# Patient Record
Sex: Female | Born: 1995 | Race: Black or African American | Hispanic: No | Marital: Single | Smoking: Never smoker
Health system: Southern US, Community
[De-identification: ages and names within clinical notes are randomized; demographics above are authoritative.]

## PROBLEM LIST (undated history)

## (undated) DIAGNOSIS — Z789 Other specified health status: Secondary | ICD-10-CM

## (undated) HISTORY — PX: NO PAST SURGERIES: SHX2092

## (undated) HISTORY — DX: Other specified health status: Z78.9

---
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
## 2014-07-06 ENCOUNTER — Encounter (HOSPITAL_COMMUNITY): Payer: Self-pay | Admitting: Emergency Medicine

## 2015-01-03 ENCOUNTER — Emergency Department (HOSPITAL_COMMUNITY)
Admission: EM | Admit: 2015-01-03 | Discharge: 2015-01-03 | Disposition: A | Discharge status: Home / Self Care | Payer: Self-pay | Attending: Emergency Medicine | Admitting: Emergency Medicine

## 2015-01-03 ENCOUNTER — Encounter (HOSPITAL_COMMUNITY): Payer: Self-pay | Admitting: Emergency Medicine

## 2015-01-03 DIAGNOSIS — H00013 Hordeolum externum right eye, unspecified eyelid: Secondary | ICD-10-CM

## 2015-01-03 DIAGNOSIS — H00011 Hordeolum externum right upper eyelid: Secondary | ICD-10-CM | POA: Insufficient documentation

## 2015-01-03 MED ORDER — TOBRAMYCIN 0.3 % OP SOLN
1.0000 [drp] | Freq: Once | OPHTHALMIC | Status: AC
  Administered 2015-01-03: 1 [drp] via OPHTHALMIC
  Filled 2015-01-03: qty 5

## 2015-01-03 NOTE — ED Notes (Addendum)
Patient c/o swelling to right eyelid x3-4 days. Per patient pain with blinking. Denies any blurred vision/vision changes. Patient reports using warm compress and benadryl with no relief.

## 2015-01-03 NOTE — Discharge Instructions (Signed)

## 2015-01-05 NOTE — ED Provider Notes (Signed)
CSN: [id number]     Arrival date & time 01/03/15  [id number] History   First MD Initiated Contact with Patient 01/03/15 1005     Chief Complaint  Patient presents with  . Belepharitis     (Consider location/radiation/quality/duration/timing/severity/associated sxs/prior Treatment) HPI  Julie [name] is a 19 y.o. female who presents to the Emergency Department complaining of pain, swelling and redness of the right upper eyelid.  Sx's present for 3-4 days.  Pain is worse with blinking.  She has tried benadryl and warm compresses without relief.  She denies recent illness, headaches, dizziness, or visual changes.     Past Medical History  Diagnosis Date  . Pregnant    History reviewed. No pertinent past surgical history. History reviewed. No pertinent family history. History  Substance Use Topics  . Smoking status: Never Smoker   . Smokeless tobacco: Never Used  . Alcohol Use: No   OB History    Gravida Para Term Preterm AB TAB SAB Ectopic Multiple Living   1         1     Review of Systems  Constitutional: Negative for fever, activity change and appetite change.  HENT: Negative for congestion, ear pain and trouble swallowing.   Eyes: Positive for pain and redness. Negative for discharge and visual disturbance.  Gastrointestinal: Negative for nausea, vomiting and abdominal pain.  Musculoskeletal: Negative for arthralgias.  Psychiatric/Behavioral: Negative for confusion.  All other systems reviewed and are negative.     Allergies  Bee venom  Home Medications   Prior to Admission medications   Medication Sig Start Date End Date Taking? Authorizing Provider  diphenhydrAMINE (BENADRYL) 25 MG tablet Take 50 mg by mouth daily as needed for allergies.   Yes Historical Provider, MD  etonogestrel (NEXPLANON) 68 MG IMPL implant 1 each by Subdermal route once.   Yes Historical Provider, MD   BP 125/92 mmHg  Pulse 106  Temp(Src) 98.2 F (36.8 C) (Oral)  Resp 18  Ht 5' 5.5"  (1.664 m)  Wt 133 lb 14.4 oz (60.737 kg)  BMI 21.94 kg/m2  SpO2 100%  LMP 12/18/2014 Physical Exam  Constitutional: She is oriented to person, place, and time. She appears well-developed and well-nourished. No distress.  HENT:  Head: Normocephalic and atraumatic.  Eyes: Conjunctivae and EOM are normal. Pupils are equal, round, and reactive to light. Lids are everted and swept, no foreign bodies found. Right eye exhibits hordeolum. Right conjunctiva is not injected. Right conjunctiva has no hemorrhage. Right eye exhibits normal extraocular motion. Pupils are equal.    Neck: Normal range of motion. Neck supple.  Cardiovascular: Normal rate, regular rhythm, normal heart sounds and intact distal pulses.   Pulmonary/Chest: Effort normal and breath sounds normal.  Musculoskeletal: Normal range of motion.  Lymphadenopathy:    She has no cervical adenopathy.  Neurological: She is alert and oriented to person, place, and time. She exhibits normal muscle tone. Coordination normal.  Skin: Skin is warm.  Psychiatric: She has a normal mood and affect.  Nursing note and vitals reviewed.   ED Course  Procedures (including critical care time) Labs Review Labs Reviewed - No data to display  Imaging Review No results found.   EKG Interpretation None         Visual Acuity  Right Eye Distance: 20/30 Left Eye Distance: 20/20 Bilateral Distance: 20/20  Right Eye Near:   Left Eye Near:    Bilateral Near:     MDM   Final diagnoses:  Hordeolum externum (stye), right    Pt with localized tenderness and edema of the right upper eyelid.  C/w stye.  Pt agrees to warm compresses, dispensed tobramycin eye drops and referral to Dr. Lita Mains if needed.    Pauline Aus, PA-C 01/05/15 1745  Mancel Bale, MD 01/07/15 1345

## 2015-02-02 ENCOUNTER — Emergency Department (HOSPITAL_COMMUNITY)
Admission: EM | Admit: 2015-02-02 | Discharge: 2015-02-02 | Disposition: A | Discharge status: Home / Self Care | Payer: Self-pay | Attending: Emergency Medicine | Admitting: Emergency Medicine

## 2015-02-02 ENCOUNTER — Encounter (HOSPITAL_COMMUNITY): Payer: Self-pay | Admitting: *Deleted

## 2015-02-02 DIAGNOSIS — Z3202 Encounter for pregnancy test, result negative: Secondary | ICD-10-CM | POA: Insufficient documentation

## 2015-02-02 DIAGNOSIS — R300 Dysuria: Secondary | ICD-10-CM | POA: Insufficient documentation

## 2015-02-02 LAB — URINE MICROSCOPIC-ADD ON

## 2015-02-02 LAB — URINALYSIS, ROUTINE W REFLEX MICROSCOPIC
BILIRUBIN URINE: NEGATIVE
GLUCOSE, UA: NEGATIVE mg/dL
KETONES UR: 15 mg/dL — AB
Leukocytes, UA: NEGATIVE
NITRITE: NEGATIVE
Protein, ur: NEGATIVE mg/dL
Urobilinogen, UA: 0.2 mg/dL (ref 0.0–1.0)
pH: 6 (ref 5.0–8.0)

## 2015-02-02 LAB — WET PREP, GENITAL
Trich, Wet Prep: NONE SEEN
Yeast Wet Prep HPF POC: NONE SEEN

## 2015-02-02 LAB — PREGNANCY, URINE: Preg Test, Ur: NEGATIVE

## 2015-02-02 MED ORDER — AZITHROMYCIN 250 MG PO TABS
1000.0000 mg | ORAL_TABLET | Freq: Once | ORAL | Status: AC
  Administered 2015-02-02: 1000 mg via ORAL
  Filled 2015-02-02 (×2): qty 4

## 2015-02-02 NOTE — ED Notes (Signed)
Pt c/o painful urination that has gotten progressively worse over the last few weeks

## 2015-02-02 NOTE — ED Notes (Signed)
Unable to review d/c instructions with pt due to pt leaving

## 2015-02-02 NOTE — ED Notes (Signed)
Pt had returned to room and was able to give med and review d/c instructions. Pt stated that she left to go get her phone charger in her car

## 2015-02-02 NOTE — ED Provider Notes (Signed)
CSN: [id number]     Arrival date & time 02/02/15  2003 History   First MD Initiated Contact with Patient 02/02/15 2048     Chief Complaint  Patient presents with  . Dysuria     (Consider location/radiation/quality/duration/timing/severity/associated sxs/prior Treatment) Patient is a 19 y.o. female presenting with dysuria. The history is provided by the patient.  Dysuria Pain quality:  Burning Onset quality:  Gradual Duration:  6 days Timing:  Constant Progression:  Worsening Chronicity:  New Recent urinary tract infections: no   Relieved by:  None tried Worsened by:  Nothing tried Ineffective treatments:  None tried  Julie [name] is a 19 y.o. female who presents to the ED with dysuria. LMP 5/23 and was normal. Implant for birthcontrol. Current sex partner x 2 years. Last pap smear less than 2 years ago and normal. Patient states that her boyfriend did have an STD in the past and exposed her so she had to be treated. She is not sure if it was GC or Chlamydia.    Past Medical History  Diagnosis Date  . Pregnant    History reviewed. No pertinent past surgical history. History reviewed. No pertinent family history. History  Substance Use Topics  . Smoking status: Never Smoker   . Smokeless tobacco: Never Used  . Alcohol Use: No   OB History    Gravida Para Term Preterm AB TAB SAB Ectopic Multiple Living   1         1     Review of Systems  Genitourinary: Positive for dysuria.  all other systems negative    Allergies  Bee venom  Home Medications   Prior to Admission medications   Medication Sig Start Date End Date Taking? Authorizing Provider  diphenhydrAMINE (BENADRYL) 25 MG tablet Take 50 mg by mouth daily as needed for allergies.    Historical Provider, MD  etonogestrel (NEXPLANON) 68 MG IMPL implant 1 each by Subdermal route once.    Historical Provider, MD   BP 144/80 mmHg  Pulse 71  Temp(Src) 98.7 F (37.1 C) (Oral)  Resp 20  Ht 5' 5.5" (1.664 m)   Wt 136 lb (61.689 kg)  BMI 22.28 kg/m2  SpO2 100%  LMP 01/25/2015 Physical Exam  Constitutional: She is oriented to person, place, and time. She appears well-developed and well-nourished. No distress.  HENT:  Head: Normocephalic.  Eyes: Conjunctivae and EOM are normal.  Neck: Normal range of motion. Neck supple.  Cardiovascular: Normal rate and regular rhythm.   Pulmonary/Chest: Effort normal and breath sounds normal.  Abdominal: Soft. Bowel sounds are normal. There is no tenderness.  Genitourinary:  External genitalia without lesions, white d/c vaginal vault. No CMT, no adnexal tenderness, uterus without palpable enlargement.   Musculoskeletal: Normal range of motion.  Neurological: She is alert and oriented to person, place, and time. No cranial nerve deficit.  Skin: Skin is warm and dry.  Psychiatric: She has a normal mood and affect. Her behavior is normal.  Nursing note and vitals reviewed.   ED Course  Procedures  Results for orders placed or performed during the hospital encounter of 02/02/15 (from the past 24 hour(s))  Urinalysis, Routine w reflex microscopic     Status: Abnormal   Collection Time: 02/02/15  8:51 PM  Result Value Ref Range   Color, Urine YELLOW YELLOW   APPearance CLEAR CLEAR   Specific Gravity, Urine >1.030 (H) 1.005 - 1.030   pH 6.0 5.0 - 8.0   Glucose, UA NEGATIVE  NEGATIVE mg/dL   Hgb urine dipstick TRACE (A) NEGATIVE   Bilirubin Urine NEGATIVE NEGATIVE   Ketones, ur 15 (A) NEGATIVE mg/dL   Protein, ur NEGATIVE NEGATIVE mg/dL   Urobilinogen, UA 0.2 0.0 - 1.0 mg/dL   Nitrite NEGATIVE NEGATIVE   Leukocytes, UA NEGATIVE NEGATIVE  Pregnancy, urine     Status: None   Collection Time: 02/02/15  8:51 PM  Result Value Ref Range   Preg Test, Ur NEGATIVE NEGATIVE  Urine microscopic-add on     Status: Abnormal   Collection Time: 02/02/15  8:51 PM  Result Value Ref Range   Squamous Epithelial / LPF FEW (A) RARE   WBC, UA 0-2 <3 WBC/hpf   RBC / HPF 0-2  <3 RBC/hpf   Bacteria, UA FEW (A) RARE   Urine-Other MUCOUS PRESENT   Wet prep, genital     Status: Abnormal   Collection Time: 02/02/15 10:36 PM  Result Value Ref Range   Yeast Wet Prep HPF POC NONE SEEN NONE SEEN   Trich, Wet Prep NONE SEEN NONE SEEN   Clue Cells Wet Prep HPF POC FEW (A) NONE SEEN   WBC, Wet Prep HPF POC FEW (A) NONE SEEN     MDM  19 y.o. female with dysuria and no other symptoms. Culture for Urine, GC and Chlamydia pending. Will treat with Zithromax while cultures pending. Patient will drink cranberry juice and take OTC AZO. She will return for worsening symptoms. She understands that we will only call for positive cultures. Stable for d/c without signs of pyelo or PID.    Final diagnoses:  Dysuria       Janne NapoleonHope M Neese, NP 02/02/15 [id number]  Vanetta MuldersScott Zackowski, MD 02/04/15 [phone]

## 2015-02-02 NOTE — Discharge Instructions (Signed)
We have sent the urine for culture and will only call if you need additional treatment.

## 2015-02-02 NOTE — ED Notes (Signed)
Went in pt's room to give zithromax and pt no longer in room, attempted to call number provided on snapshot and unable to have call go through, [facility] NP made aware that pt was no longer in room

## 2015-02-04 LAB — URINE CULTURE

## 2015-02-04 LAB — GC/CHLAMYDIA PROBE AMP (~~LOC~~) NOT AT ARMC
Chlamydia: NEGATIVE
NEISSERIA GONORRHEA: NEGATIVE

## 2015-08-24 ENCOUNTER — Encounter (HOSPITAL_COMMUNITY): Payer: Self-pay | Admitting: *Deleted

## 2015-08-24 ENCOUNTER — Emergency Department (HOSPITAL_COMMUNITY)
Admission: EM | Admit: 2015-08-24 | Discharge: 2015-08-24 | Disposition: A | Discharge status: Home / Self Care | Payer: Self-pay | Attending: Emergency Medicine | Admitting: Emergency Medicine

## 2015-08-24 DIAGNOSIS — M791 Myalgia: Secondary | ICD-10-CM | POA: Insufficient documentation

## 2015-08-24 DIAGNOSIS — J039 Acute tonsillitis, unspecified: Secondary | ICD-10-CM | POA: Insufficient documentation

## 2015-08-24 LAB — RAPID STREP SCREEN (MED CTR MEBANE ONLY): Streptococcus, Group A Screen (Direct): NEGATIVE

## 2015-08-24 MED ORDER — MAGIC MOUTHWASH W/LIDOCAINE: 5.0000 mL | Freq: Three times a day (TID) | ORAL | Status: DC | PRN

## 2015-08-24 MED ORDER — PREDNISONE 20 MG PO TABS: 40.0000 mg | ORAL_TABLET | Freq: Every day | ORAL | Status: DC

## 2015-08-24 NOTE — ED Notes (Signed)
Pt reporting sore throat since yesterday.  States that she looked at her tonsils and they are swollen and have white patches.

## 2015-08-24 NOTE — Discharge Instructions (Signed)
Tonsillitis °Tonsillitis is an infection of the throat. This infection causes the tonsils to become red, tender, and puffy (swollen). Tonsils are groups of tissue at the back of your throat. If bacteria caused your infection, antibiotic medicine will be given to you. Sometimes symptoms of tonsillitis can be relieved with the use of steroid medicine. If your tonsillitis is severe and happens often, you may need to get your tonsils removed (tonsillectomy). °HOME CARE  °· Rest and sleep often. °· Drink enough fluids to keep your pee (urine) clear or pale yellow. °· While your throat is sore, eat soft or liquid foods like: °¨ Soup. °¨ Ice cream. °¨ Instant breakfast drinks. °· Eat frozen ice pops. °· Gargle with a warm or cold liquid to help soothe the throat. Gargle with a water and salt mix. Mix 1/4 teaspoon of salt and 1/4 teaspoon of baking soda in 1 cup of water. °· Only take medicines as told by your doctor. °· If you are given medicines (antibiotics), take them as told. Finish them even if you start to feel better. °GET HELP IF: °· You have large, tender lumps in your neck. °· You have a rash. °· You cough up green, yellow-brown, or bloody fluid. °· You cannot swallow liquids or food for 24 hours. °· You notice that only one of your tonsils is swollen. °GET HELP RIGHT AWAY IF:  °· You throw up (vomit). °· You have a very bad headache. °· You have a stiff neck. °· You have chest pain. °· You have trouble breathing or swallowing. °· You have bad throat pain, drooling, or your voice changes. °· You have bad pain not helped by medicine. °· You cannot fully open your mouth. °· You have redness, puffiness, or bad pain in the neck. °· You have a fever. °MAKE SURE YOU:  °· Understand these instructions. °· Will watch your condition. °· Will get help right away if you are not doing well or get worse. °  °This information is not intended to replace advice given to you by your health care provider. Make sure you discuss any  questions you have with your health care provider. °  °Document Released: 02/07/2008 Document Revised: 08/26/2013 Document Reviewed: 02/07/2013 °[employer]vier Interactive Patient Education ©2016 [employer] ° °

## 2015-08-24 NOTE — ED Provider Notes (Signed)
CSN: [id number]     Arrival date & time 08/24/15  1901 History   First MD Initiated Contact with Patient 08/24/15 1909     Chief Complaint  Patient presents with  . Sore Throat     (Consider location/radiation/quality/duration/timing/severity/associated sxs/prior Treatment) HPI  Julie [name] is a 19 y.o. female who presents to the Emergency Department complaining of sore throat for one day.  She states that she noticed a mild pain with swallowing yesterday, but woke up with increased pain to her throat, swelling and "white patches" on her tonsils.  Pain is worse with swallowing.  She also c/o generalized body aches.  She denies congestion, cough, ear pain, and fever.  She has tried OTC analgesics w/o relief.     Past Medical History  Diagnosis Date  . Pregnant    History reviewed. No pertinent past surgical history. History reviewed. No pertinent family history. Social History  Substance Use Topics  . Smoking status: Never Smoker   . Smokeless tobacco: Never Used  . Alcohol Use: No   OB History    Gravida Para Term Preterm AB TAB SAB Ectopic Multiple Living   1         1     Review of Systems  Constitutional: Negative for fever, chills, activity change and appetite change.  HENT: Positive for congestion and sore throat. Negative for ear pain, facial swelling, trouble swallowing and voice change.   Eyes: Negative for pain and visual disturbance.  Respiratory: Negative for cough and shortness of breath.   Gastrointestinal: Negative for nausea, vomiting and abdominal pain.  Genitourinary: Negative for dysuria.  Musculoskeletal: Positive for myalgias. Negative for arthralgias, neck pain and neck stiffness.  Skin: Negative for color change and rash.  Neurological: Negative for dizziness, facial asymmetry, speech difficulty, numbness and headaches.  Hematological: Negative for adenopathy.  All other systems reviewed and are negative.     Allergies  Bee venom  Home  Medications   Prior to Admission medications   Medication Sig Start Date End Date Taking? Authorizing Provider  diphenhydrAMINE (BENADRYL) 25 MG tablet Take 50 mg by mouth daily as needed for allergies.    Historical Provider, MD  etonogestrel (NEXPLANON) 68 MG IMPL implant 1 each by Subdermal route once.    Historical Provider, MD   BP 140/75 mmHg  Pulse 92  Temp(Src) 98.8 F (37.1 C) (Oral)  Resp 15  Ht  (1.651 m)  Wt 58.968 kg  BMI 21.63 kg/m2  SpO2 100%  LMP 08/17/2015 Physical Exam  Constitutional: She is oriented to person, place, and time. She appears well-developed and well-nourished. No distress.  HENT:  Head: Normocephalic and atraumatic.  Right Ear: Tympanic membrane and ear canal normal.  Left Ear: Tympanic membrane and ear canal normal.  Mouth/Throat: Uvula is midline and mucous membranes are normal. No trismus in the jaw. No uvula swelling. Oropharyngeal exudate, posterior oropharyngeal edema and posterior oropharyngeal erythema present. No tonsillar abscesses.  Edema, erythema and exudates present bilateral tonsils.  Uvula is non-edematous and midline.  No PTA  Neck: Normal range of motion. Neck supple.  Cardiovascular: Normal rate, regular rhythm and normal heart sounds.   Pulmonary/Chest: Effort normal and breath sounds normal. No respiratory distress.  Abdominal: There is no splenomegaly. There is no tenderness.  Musculoskeletal: Normal range of motion.  Lymphadenopathy:    She has no cervical adenopathy.  Neurological: She is alert and oriented to person, place, and time. She exhibits normal muscle tone. Coordination normal.  Skin: Skin is warm and dry.  Nursing note and vitals reviewed.   ED Course  Procedures (including critical care time) Labs Review Labs Reviewed  RAPID STREP SCREEN (NOT [facility])  CULTURE, GROUP A STREP    Imaging Review No results found. I have personally reviewed and evaluated these images and lab results as part of my  medical decision-making.   EKG Interpretation None      MDM   Final diagnoses:  Tonsillitis    Pt is well appearing, non-toxic.  Airway patent.  No peritonsillar abscess.  Likely tonsillitis.  Strep screen neg although questionable false positive.  Pt agrees to increased fluids, prednisone and magic mouthwash.      Rosey Bathammy Hollis Oh, PA-C 08/25/15 2206  Azalia BilisKevin Campos, MD 08/25/15 2245

## 2015-08-26 LAB — CULTURE, GROUP A STREP

## 2015-11-19 ENCOUNTER — Emergency Department (HOSPITAL_COMMUNITY): Payer: Self-pay

## 2015-11-19 ENCOUNTER — Encounter (HOSPITAL_COMMUNITY): Payer: Self-pay | Admitting: *Deleted

## 2015-11-19 ENCOUNTER — Emergency Department (HOSPITAL_COMMUNITY)
Admission: EM | Admit: 2015-11-19 | Discharge: 2015-11-19 | Disposition: A | Discharge status: Home / Self Care | Payer: Self-pay | Attending: Emergency Medicine | Admitting: Emergency Medicine

## 2015-11-19 DIAGNOSIS — R059 Cough, unspecified: Secondary | ICD-10-CM

## 2015-11-19 DIAGNOSIS — R05 Cough: Secondary | ICD-10-CM

## 2015-11-19 DIAGNOSIS — Z79899 Other long term (current) drug therapy: Secondary | ICD-10-CM | POA: Insufficient documentation

## 2015-11-19 DIAGNOSIS — J069 Acute upper respiratory infection, unspecified: Secondary | ICD-10-CM | POA: Insufficient documentation

## 2015-11-19 MED ORDER — GUAIFENESIN-CODEINE 100-10 MG/5ML PO SYRP: 10.0000 mL | ORAL_SOLUTION | Freq: Three times a day (TID) | ORAL | Status: DC | PRN

## 2015-11-19 MED ORDER — AZITHROMYCIN 250 MG PO TABS: 250.0000 mg | ORAL_TABLET | Freq: Every day | ORAL | Status: DC

## 2015-11-19 NOTE — ED Notes (Signed)
Pt reports cough that started yesterday, productive at times, green in color. Hoarse today. Denies pain or fevers.

## 2015-11-19 NOTE — Discharge Instructions (Signed)

## 2015-11-20 NOTE — ED Provider Notes (Signed)
CSN: [id number]     Arrival date & time 11/19/15  0946 History   First MD Initiated Contact with Patient 11/19/15 1015     Chief Complaint  Patient presents with  . Cough     (Consider location/radiation/quality/duration/timing/severity/associated sxs/prior Treatment) HPI  RIO TABER is a 20 y.o. female who presents to the Emergency Department complaining of cough for one day.  She states the cough is productive at times and associated with a loss of her voice.  She reports harsh coughing.  She denies fever, shortness of breath, chest pain, abd pain, sore throat.  She has not taken any medications prior to ED arrival.     Past Medical History  Diagnosis Date  . Pregnant    History reviewed. No pertinent past surgical history. History reviewed. No pertinent family history. Social History  Substance Use Topics  . Smoking status: Never Smoker   . Smokeless tobacco: Never Used  . Alcohol Use: No   OB History    Gravida Para Term Preterm AB TAB SAB Ectopic Multiple Living   1         1     Review of Systems  Constitutional: Negative for fever, chills and appetite change.  HENT: Positive for congestion and voice change. Negative for sore throat and trouble swallowing.   Respiratory: Positive for cough and chest tightness. Negative for shortness of breath and wheezing.   Cardiovascular: Negative for chest pain.  Gastrointestinal: Negative for nausea, vomiting and abdominal pain.  Genitourinary: Negative for dysuria.  Musculoskeletal: Negative for arthralgias.  Skin: Negative for rash.  Neurological: Negative for dizziness, weakness and numbness.  Hematological: Negative for adenopathy.  All other systems reviewed and are negative.     Allergies  Bee venom  Home Medications   Prior to Admission medications   Medication Sig Start Date End Date Taking? Authorizing Provider  etonogestrel (NEXPLANON) 68 MG IMPL implant 1 each by Subdermal route once.   Yes Historical  Provider, MD  Nutritional Supplements (COLD AND FLU PO) Take 1 tablet by mouth daily as needed (cold).   Yes Historical Provider, MD  azithromycin (ZITHROMAX) 250 MG tablet Take 1 tablet (250 mg total) by mouth daily. Take first 2 tablets together, then 1 every day until finished. 11/19/15   Marciano Mundt, PA-C  guaiFENesin-codeine (ROBITUSSIN AC) 100-10 MG/5ML syrup Take 10 mLs by mouth 3 (three) times daily as needed. 11/19/15   Francile Woolford, PA-C  magic mouthwash w/lidocaine SOLN Take 5 mLs by mouth 3 (three) times daily as needed for mouth pain. Swish and spit, do not swallow Patient not taking: Reported on 11/19/2015 08/24/15   Ahron Hulbert, PA-C  predniSONE (DELTASONE) 20 MG tablet Take 2 tablets (40 mg total) by mouth daily. For 5 days Patient not taking: Reported on 11/19/2015 08/24/15   Cadon Raczka, PA-C   BP 130/81 mmHg  Pulse 91  Temp(Src) 98.8 F (37.1 C) (Temporal)  Resp 16  Ht 5' 5.5" (1.664 m)  Wt 58.968 kg  BMI 21.30 kg/m2  SpO2 100%  LMP 11/15/2015 Physical Exam  Constitutional: She is oriented to person, place, and time. She appears well-developed and well-nourished. No distress.  HENT:  Head: Normocephalic and atraumatic.  Right Ear: Tympanic membrane and ear canal normal.  Left Ear: Tympanic membrane and ear canal normal.  Mouth/Throat: Uvula is midline, oropharynx is clear and moist and mucous membranes are normal. No oropharyngeal exudate.  Eyes: EOM are normal. Pupils are equal, round, and reactive to light.  Neck: Normal range of motion, full passive range of motion without pain and phonation normal. Neck supple.  Cardiovascular: Normal rate, regular rhythm and intact distal pulses.   No murmur heard. Pulmonary/Chest: Effort normal. No stridor. No respiratory distress. She has no wheezes. She has no rales. She exhibits no tenderness.  Coarse lungs sounds bilaterally.  No rales or wheezing.    Musculoskeletal: She exhibits no edema.  Lymphadenopathy:     She has no cervical adenopathy.  Neurological: She is alert and oriented to person, place, and time. She exhibits normal muscle tone. Coordination normal.  Skin: Skin is warm and dry.  Nursing note and vitals reviewed.   ED Course  Procedures (including critical care time) Labs Review Labs Reviewed - No data to display  Imaging Review Dg Chest 2 View  11/19/2015  CLINICAL DATA:  Cough for 1 day EXAM: CHEST  2 VIEW COMPARISON:  03/12/2004 FINDINGS: Normal heart size. Tenting of the left hemidiaphragm is stable. Lungs are otherwise clear. No pneumothorax. No pleural effusion. IMPRESSION: No active cardiopulmonary disease. Electronically Signed   By: Jolaine ClickArthur  Hoss M.D.   On: 11/19/2015 10:51   I have personally reviewed and evaluated these images and lab results as part of my medical decision-making.   EKG Interpretation None      MDM   Final diagnoses:  URI (upper respiratory infection)  Cough    Pt is not pregnant, has nexplanon implant.    Well appearing, non-toxic. Vitals stable.  No respiratory distress.  CXR for PNA.  Pt stable for d/c and tx plan discussed and appears stable for d/c    Pauline Ausammy Claritza July, PA-C 11/20/15 2326  Samuel JesterKathleen McManus, DO 11/24/15 1743

## 2016-01-17 ENCOUNTER — Encounter (HOSPITAL_COMMUNITY): Payer: Self-pay

## 2016-01-17 ENCOUNTER — Emergency Department (HOSPITAL_COMMUNITY)
Admission: EM | Admit: 2016-01-17 | Discharge: 2016-01-17 | Disposition: A | Discharge status: Home / Self Care | Payer: Self-pay | Attending: Emergency Medicine | Admitting: Emergency Medicine

## 2016-01-17 DIAGNOSIS — Z79899 Other long term (current) drug therapy: Secondary | ICD-10-CM | POA: Insufficient documentation

## 2016-01-17 DIAGNOSIS — B349 Viral infection, unspecified: Secondary | ICD-10-CM | POA: Insufficient documentation

## 2016-01-17 DIAGNOSIS — R109 Unspecified abdominal pain: Secondary | ICD-10-CM | POA: Insufficient documentation

## 2016-01-17 MED ORDER — PROMETHAZINE-DM 6.25-15 MG/5ML PO SYRP: 10.0000 mL | ORAL_SOLUTION | Freq: Four times a day (QID) | ORAL | Status: DC | PRN

## 2016-01-17 MED ORDER — LORATADINE-PSEUDOEPHEDRINE ER 5-120 MG PO TB12: 1.0000 | ORAL_TABLET | Freq: Two times a day (BID) | ORAL | Status: DC

## 2016-01-17 NOTE — ED Provider Notes (Signed)
CSN: [id number]     Arrival date & time 01/17/16  1134 History  By signing my name below, I, Placido SouLogan Joldersma, attest that this documentation has been prepared under the direction and in the presence of Ivery QualeHobson Shirle Provencal, PA-C. Electronically Signed: Placido SouLogan Joldersma, ED Scribe. 01/17/2016. 1:35 PM.   Chief Complaint  Patient presents with  . Nasal Congestion   The history is provided by the patient. No language interpreter was used.    HPI Comments: Julie [name] is a 20 y.o. female who is otherwise healthy presents to the Emergency Department complaining of mild nasal congestion onset 2 days ago and worsened beginning last night. She reports associated fatigue, abd pain and vomiting which she describes as yellow with 3x including traces of blood. She denies any known sick contacts. Pt denies fevers, chills, urinary frequency or dysuria.   No past medical history on file. History reviewed. No pertinent past surgical history. No family history on file. Social History  Substance Use Topics  . Smoking status: Never Smoker   . Smokeless tobacco: Never Used  . Alcohol Use: No   OB History    Gravida Para Term Preterm AB TAB SAB Ectopic Multiple Living   1         1     Review of Systems  Constitutional: Positive for fatigue. Negative for fever and chills.  HENT: Positive for congestion and rhinorrhea.   Gastrointestinal: Positive for nausea, vomiting and abdominal pain.  Genitourinary: Negative for dysuria and frequency.  All other systems reviewed and are negative.   Allergies  Bee venom  Home Medications   Prior to Admission medications   Medication Sig Start Date End Date Taking? Authorizing Provider  azithromycin (ZITHROMAX) 250 MG tablet Take 1 tablet (250 mg total) by mouth daily. Take first 2 tablets together, then 1 every day until finished. 11/19/15   Tammy Triplett, PA-C  etonogestrel (NEXPLANON) 68 MG IMPL implant 1 each by Subdermal route once.    Historical Provider, MD   guaiFENesin-codeine (ROBITUSSIN AC) 100-10 MG/5ML syrup Take 10 mLs by mouth 3 (three) times daily as needed. 11/19/15   Tammy Triplett, PA-C  magic mouthwash w/lidocaine SOLN Take 5 mLs by mouth 3 (three) times daily as needed for mouth pain. Swish and spit, do not swallow Patient not taking: Reported on 11/19/2015 08/24/15   Tammy Triplett, PA-C  Nutritional Supplements (COLD AND FLU PO) Take 1 tablet by mouth daily as needed (cold).    Historical Provider, MD  predniSONE (DELTASONE) 20 MG tablet Take 2 tablets (40 mg total) by mouth daily. For 5 days Patient not taking: Reported on 11/19/2015 08/24/15   Tammy Triplett, PA-C   BP 142/76 mmHg  Pulse 101  Temp(Src) 98.5 F (36.9 C) (Oral)  Resp 18  Ht 5' 5.5" (1.664 m)  Wt 144 lb 8 oz (65.545 kg)  BMI 23.67 kg/m2  SpO2 98%  LMP 12/16/2015    Physical Exam  Constitutional: She is oriented to person, place, and time. She appears well-developed and well-nourished.  HENT:  Head: Normocephalic and atraumatic.  Nose: Rhinorrhea present.  Mouth/Throat: Uvula swelling present.  Mild swelling of the uvula. Craters noted of the tonsils. No abscess visualized. Nasal congestion is present.   Eyes: EOM are normal.  Neck: Normal range of motion.  Cardiovascular: Normal rate.   Pulses:      Dorsalis pedis pulses are 2+ on the right side, and 2+ on the left side.  Pulmonary/Chest: Effort normal and breath sounds  normal. No respiratory distress. She has no wheezes. She has no rales. She exhibits no tenderness.  Symmetrical rise and fall of the chest.   Abdominal: Soft. Bowel sounds are normal. There is tenderness.  Diffuse soreness of the abd.   Musculoskeletal: Normal range of motion.  Lymphadenopathy:    She has no cervical adenopathy.  Neurological: She is alert and oriented to person, place, and time.  Skin: Skin is warm and dry. No rash noted.  Psychiatric: She has a normal mood and affect.  Nursing note and vitals reviewed.   ED  Course  Procedures  DIAGNOSTIC STUDIES: Oxygen Saturation is 98% on RA, normal by my interpretation.    COORDINATION OF CARE: 1:30 PM Discussed next steps with pt including Claritin D, Promethazine DM, tylenol and a recommendation for increased fluids. Return precautions noted. She verbalized understanding and is agreeable with the plan.   Labs Review Labs Reviewed - No data to display  Imaging Review No results found.   EKG Interpretation None      MDM  The examination favors a viral illness. The vital signs have been reviewed. A mask is been provided for the patient. Patient is treated with Claritin-D and Promethazine DM. The patient is to follow the primary physician if not improving, or return to the emergency room.    Final diagnoses:  Viral illness    *I have reviewed nursing notes, vital signs, and all appropriate lab and imaging results for this patient.**  **I personally performed the services described in this documentation, which was scribed in my presence. The recorded information has been reviewed and is accurate.   Ivery Quale, PA-C 01/17/16 2133  Geoffery Lyons, MD 01/18/16 [phone]

## 2016-01-17 NOTE — ED Notes (Signed)
Pt with productive cough of thick yellow phlegm for 2 days, denies fever, body aches and intermittent chills and feeling hot. Denies taking anything for it

## 2016-01-17 NOTE — ED Notes (Addendum)
Pt reports that she woke up with nasal congestion and cough. Coughing which is making her vomit. Feels weak and has generalized body aches

## 2016-01-17 NOTE — Discharge Instructions (Signed)
Wash hands frequently. Increase fluids. Use medications as suggested. Use tylenol every 4 hours or ibuprofen every 6 hours for fever or aching. Viral Infections A viral infection can be caused by different types of viruses.Most viral infections are not serious and resolve on their own. However, some infections may cause severe symptoms and may lead to further complications. SYMPTOMS Viruses can frequently cause:  Minor sore throat.  Aches and pains.  Headaches.  Runny nose.  Different types of rashes.  Watery eyes.  Tiredness.  Cough.  Loss of appetite.  Gastrointestinal infections, resulting in nausea, vomiting, and diarrhea. These symptoms do not respond to antibiotics because the infection is not caused by bacteria. However, you might catch a bacterial infection following the viral infection. This is sometimes called a "superinfection." Symptoms of such a bacterial infection may include:  Worsening sore throat with pus and difficulty swallowing.  Swollen neck glands.  Chills and a high or persistent fever.  Severe headache.  Tenderness over the sinuses.  Persistent overall ill feeling (malaise), muscle aches, and tiredness (fatigue).  Persistent cough.  Yellow, green, or brown mucus production with coughing. HOME CARE INSTRUCTIONS   Only take over-the-counter or prescription medicines for pain, discomfort, diarrhea, or fever as directed by your caregiver.  Drink enough water and fluids to keep your urine clear or pale yellow. Sports drinks can provide valuable electrolytes, sugars, and hydration.  Get plenty of rest and maintain proper nutrition. Soups and broths with crackers or rice are fine. SEEK IMMEDIATE MEDICAL CARE IF:   You have severe headaches, shortness of breath, chest pain, neck pain, or an unusual rash.  You have uncontrolled vomiting, diarrhea, or you are unable to keep down fluids.  You or your child has an oral temperature above 102 F  (38.9 C), not controlled by medicine.  Your baby is older than 3 months with a rectal temperature of 102 F (38.9 C) or higher.  Your baby is 23 months old or younger with a rectal temperature of 100.4 F (38 C) or higher. MAKE SURE YOU:   Understand these instructions.  Will watch your condition.  Will get help right away if you are not doing well or get worse.   This information is not intended to replace advice given to you by your health care provider. Make sure you discuss any questions you have with your health care provider.   Document Released: 05/31/2005 Document Revised: 11/13/2011 Document Reviewed: 01/27/2015 [employer] Interactive Patient Education [employer]2016 [employer]

## 2016-05-19 ENCOUNTER — Encounter (HOSPITAL_COMMUNITY): Payer: Self-pay | Admitting: *Deleted

## 2016-05-19 ENCOUNTER — Emergency Department (HOSPITAL_COMMUNITY)
Admission: EM | Admit: 2016-05-19 | Discharge: 2016-05-19 | Disposition: A | Discharge status: Home / Self Care | Payer: Self-pay | Attending: Emergency Medicine | Admitting: Emergency Medicine

## 2016-05-19 ENCOUNTER — Emergency Department (HOSPITAL_COMMUNITY): Payer: Self-pay

## 2016-05-19 DIAGNOSIS — J069 Acute upper respiratory infection, unspecified: Secondary | ICD-10-CM | POA: Insufficient documentation

## 2016-05-19 DIAGNOSIS — Z79899 Other long term (current) drug therapy: Secondary | ICD-10-CM | POA: Insufficient documentation

## 2016-05-19 DIAGNOSIS — R112 Nausea with vomiting, unspecified: Secondary | ICD-10-CM | POA: Insufficient documentation

## 2016-05-19 DIAGNOSIS — R197 Diarrhea, unspecified: Secondary | ICD-10-CM | POA: Insufficient documentation

## 2016-05-19 LAB — COMPREHENSIVE METABOLIC PANEL
ALBUMIN: 4.2 g/dL (ref 3.5–5.0)
ALT: 26 U/L (ref 14–54)
ANION GAP: 12 (ref 5–15)
AST: 20 U/L (ref 15–41)
Alkaline Phosphatase: 66 U/L (ref 38–126)
BUN: 6 mg/dL (ref 6–20)
CHLORIDE: 99 mmol/L — AB (ref 101–111)
CO2: 25 mmol/L (ref 22–32)
Calcium: 9.9 mg/dL (ref 8.9–10.3)
Creatinine, Ser: 0.74 mg/dL (ref 0.44–1.00)
GFR calc non Af Amer: >60 mL/min (ref >60)
GLUCOSE: 83 mg/dL (ref 65–99)
POTASSIUM: 4 mmol/L (ref 3.5–5.1)
Sodium: 136 mmol/L (ref 135–145)
Total Bilirubin: 0.5 mg/dL (ref 0.3–1.2)
Total Protein: 8.5 g/dL — ABNORMAL HIGH (ref 6.5–8.1)

## 2016-05-19 LAB — URINALYSIS, ROUTINE W REFLEX MICROSCOPIC
Bilirubin Urine: NEGATIVE
Glucose, UA: NEGATIVE mg/dL
Ketones, ur: >80 mg/dL — AB
Leukocytes, UA: NEGATIVE
NITRITE: NEGATIVE
Protein, ur: NEGATIVE mg/dL
SPECIFIC GRAVITY, URINE: 1.01 (ref 1.005–1.030)
pH: 6.5 (ref 5.0–8.0)

## 2016-05-19 LAB — LIPASE, BLOOD: LIPASE: 14 U/L (ref 11–51)

## 2016-05-19 LAB — URINE MICROSCOPIC-ADD ON: WBC UA: NONE SEEN WBC/hpf (ref 0–5)

## 2016-05-19 LAB — CBC
HEMATOCRIT: 39.7 % (ref 36.0–46.0)
HEMOGLOBIN: 13.6 g/dL (ref 12.0–15.0)
MCH: 30.2 pg (ref 26.0–34.0)
MCHC: 34.3 g/dL (ref 30.0–36.0)
MCV: 88.2 fL (ref 78.0–100.0)
Platelets: 286 10*3/uL (ref 150–400)
RBC: 4.5 MIL/uL (ref 3.87–5.11)
RDW: 13 % (ref 11.5–15.5)
WBC: 9.8 10*3/uL (ref 4.0–10.5)

## 2016-05-19 LAB — PREGNANCY, URINE: PREG TEST UR: NEGATIVE

## 2016-05-19 MED ORDER — GI COCKTAIL ~~LOC~~
30.0000 mL | Freq: Once | ORAL | Status: AC
  Administered 2016-05-19: 30 mL via ORAL
  Filled 2016-05-19: qty 30

## 2016-05-19 MED ORDER — ONDANSETRON HCL 4 MG PO TABS: 4.0000 mg | ORAL_TABLET | Freq: Three times a day (TID) | ORAL | 0 refills | Status: DC | PRN

## 2016-05-19 MED ORDER — ONDANSETRON 8 MG PO TBDP
8.0000 mg | ORAL_TABLET | Freq: Once | ORAL | Status: AC
  Administered 2016-05-19: 8 mg via ORAL
  Filled 2016-05-19: qty 1

## 2016-05-19 NOTE — ED Provider Notes (Signed)
[facility] DEPT Provider Note   CSN: [id number] Arrival date & time: 05/19/16  4098     History   Chief Complaint Chief Complaint  Patient presents with  . Abdominal Pain  . Cough    HPI Julie [name] is a 21 y.o. female.  HPI  Pt was seen at 1140. Per pt, c/o gradual onset and persistence of multiple intermittent episodes of N/V/D that began 3 days ago.   Describes the stools as "watery."  Has been associated with upper abd "pain," sinus congestion, runny/stuffy nose and cough. Denies CP/SOB, no back pain, no fevers, no black or blood in stools or emesis.    History reviewed. No pertinent past medical history.  There are no active problems to display for this patient.   History reviewed. No pertinent surgical history.  OB History    Gravida Para Term Preterm AB Living   1         1   SAB TAB Ectopic Multiple Live Births                   Home Medications    Prior to Admission medications   Medication Sig Start Date End Date Taking? Authorizing Provider  Nutritional Supplements (COLD AND FLU PO) Take 2 capsules by mouth every 4 (four) hours as needed (cold).   Yes Historical Provider, MD  etonogestrel (NEXPLANON) 68 MG IMPL implant 1 each by Subdermal route once.    Historical Provider, MD    Family History No family history on file.  Social History Social History  Substance Use Topics  . Smoking status: Never Smoker  . Smokeless tobacco: Never Used  . Alcohol use No     Allergies   Bee venom   Review of Systems Review of Systems ROS: Statement: All systems negative except as marked or noted in the HPI; Constitutional: Negative for fever and chills. ; ; Eyes: Negative for eye pain, redness and discharge. ; ; ENMT: Negative for ear pain, hoarseness, sore throat. +nasal congestion, sinus pressure and rhinorrhea. ; ; Cardiovascular: Negative for chest pain, palpitations, diaphoresis, dyspnea and peripheral edema. ; ; Respiratory: +cough. Negative  for wheezing and stridor. ; ; Gastrointestinal: +N/V/D, abd pain. Negative for blood in stool, hematemesis, jaundice and rectal bleeding. . ; ; Genitourinary: Negative for dysuria, flank pain and hematuria. ; ; Musculoskeletal: Negative for back pain and neck pain. Negative for swelling and trauma.; ; Skin: Negative for pruritus, rash, abrasions, blisters, bruising and skin lesion.; ; Neuro: Negative for headache, lightheadedness and neck stiffness. Negative for weakness, altered level of consciousness, altered mental status, extremity weakness, paresthesias, involuntary movement, seizure and syncope.       Physical Exam Updated Vital Signs BP 120/73 (BP Location: Left Arm)   Pulse 88   Temp 98.2 F (36.8 C) (Oral)   Resp 14   Ht 5\' 5"  (1.651 m)   Wt 139 lb (63 kg)   LMP 04/23/2016 Comment: neg preg test  SpO2 99%   BMI 23.13 kg/m   Physical Exam 1145: Physical examination:  Nursing notes reviewed; Vital signs and O2 SAT reviewed;  Constitutional: Well developed, Well nourished, Well hydrated, In no acute distress; Head:  Normocephalic, atraumatic; Eyes: EOMI, PERRL, No scleral icterus; ENMT: TM's clear bilat. +edemetous nasal turbinates bilat with clear rhinorrhea. Mouth and pharynx without lesions. No tonsillar exudates. No intra-oral edema. No submandibular or sublingual edema. No hoarse voice, no drooling, no stridor. No pain with manipulation of larynx. No trismus.  Mouth and pharynx normal, Mucous membranes moist; Neck: Supple, Full range of motion, No lymphadenopathy; Cardiovascular: Regular rate and rhythm, No murmur, rub, or gallop; Respiratory: Breath sounds clear & equal bilaterally, No rales, rhonchi, wheezes.  Speaking full sentences with ease, Normal respiratory effort/excursion; Chest: Nontender, Movement normal; Abdomen: Soft, +mid mid-epigastric tenderness to palp. No rebound or guarding. Nondistended, Normal bowel sounds; Genitourinary: No CVA tenderness; Extremities: Pulses  normal, No tenderness, No edema, No calf edema or asymmetry.; Neuro: AA&Ox3, Major CN grossly intact.  Speech clear. No gross focal motor or sensory deficits in extremities. Climbs on and off stretcher easily by herself. Gait steady.; Skin: Color normal, Warm, Dry.   ED Treatments / Results  Labs (all labs ordered are listed, but only abnormal results are displayed)   EKG  EKG Interpretation None       Radiology   Procedures Procedures (including critical care time)  Medications Ordered in ED Medications  ondansetron (ZOFRAN-ODT) disintegrating tablet 8 mg (8 mg Oral Given 05/19/16 1212)  gi cocktail (Maalox,Lidocaine,Donnatal) (30 mLs Oral Given 05/19/16 1212)     Initial Impression / Assessment and Plan / ED Course  I have reviewed the triage vital signs and the nursing notes.  Pertinent labs & imaging results that were available during my care of the patient were reviewed by me and considered in my medical decision making (see chart for details).  MDM Reviewed: previous chart, nursing note and vitals Reviewed previous: labs Interpretation: labs and x-ray    Results for orders placed or performed during the hospital encounter of 05/19/16  Lipase, blood  Result Value Ref Range   Lipase 14 11 - 51 U/L  Comprehensive metabolic panel  Result Value Ref Range   Sodium 136 135 - 145 mmol/L   Potassium 4.0 3.5 - 5.1 mmol/L   Chloride 99 (L) 101 - 111 mmol/L   CO2 25 22 - 32 mmol/L   Glucose, Bld 83 65 - 99 mg/dL   BUN 6 6 - 20 mg/dL   Creatinine, Ser 8.290.74 0.44 - 1.00 mg/dL   Calcium 9.9 8.9 - 56.210.3 mg/dL   Total Protein 8.5 (H) 6.5 - 8.1 g/dL   Albumin 4.2 3.5 - 5.0 g/dL   AST 20 15 - 41 U/L   ALT 26 14 - 54 U/L   Alkaline Phosphatase 66 38 - 126 U/L   Total Bilirubin 0.5 0.3 - 1.2 mg/dL   GFR calc non Af Amer >60 >60 mL/min   GFR calc Af Amer >60 >60 mL/min   Anion gap 12 5 - 15  CBC  Result Value Ref Range   WBC 9.8 4.0 - 10.5 K/uL   RBC 4.50 3.87 - 5.11  MIL/uL   Hemoglobin 13.6 12.0 - 15.0 g/dL   HCT 13.039.7 86.536.0 - 78.446.0 %   MCV 88.2 78.0 - 100.0 fL   MCH 30.2 26.0 - 34.0 pg   MCHC 34.3 30.0 - 36.0 g/dL   RDW 69.613.0 29.511.5 - 28.415.5 %   Platelets 286 150 - 400 K/uL  Urinalysis, Routine w reflex microscopic  Result Value Ref Range   Color, Urine YELLOW YELLOW   APPearance CLEAR CLEAR   Specific Gravity, Urine 1.010 1.005 - 1.030   pH 6.5 5.0 - 8.0   Glucose, UA NEGATIVE NEGATIVE mg/dL   Hgb urine dipstick MODERATE (A) NEGATIVE   Bilirubin Urine NEGATIVE NEGATIVE   Ketones, ur >80 (A) NEGATIVE mg/dL   Protein, ur NEGATIVE NEGATIVE mg/dL   Nitrite NEGATIVE NEGATIVE  Leukocytes, UA NEGATIVE NEGATIVE  Pregnancy, urine  Result Value Ref Range   Preg Test, Ur NEGATIVE NEGATIVE  Urine microscopic-add on  Result Value Ref Range   Squamous Epithelial / LPF TOO NUMEROUS TO COUNT (A) NONE SEEN   WBC, UA NONE SEEN 0 - 5 WBC/hpf   RBC / HPF 6-30 0 - 5 RBC/hpf   Bacteria, UA MANY (A) NONE SEEN   Dg Abd Acute W/chest Result Date: 05/19/2016 CLINICAL DATA:  Severe abdominal pain, nausea, vomiting, diarrhea EXAM: DG ABDOMEN ACUTE W/ 1V CHEST COMPARISON:  None. FINDINGS: There is no evidence of dilated bowel loops or free intraperitoneal air. No radiopaque calculi or other significant radiographic abnormality is seen. Heart size and mediastinal contours are within normal limits. Both lungs are clear. IMPRESSION: Negative abdominal radiographs.  No acute cardiopulmonary disease. Electronically Signed   By: Elige Ko   On: 05/19/2016 12:06    1355:  Udip contaminated. Pt has tol PO well while in the ED without N/V.  No stooling while in the ED.  Abd benign, VSS. Feels better and wants to go home now. Dx and testing d/w pt.  Questions answered.  Verb understanding, agreeable to d/c home with outpt f/u.     Final Clinical Impressions(s) / ED Diagnoses   Final diagnoses:  None    New Prescriptions New Prescriptions   No medications on file       Samuel Jester, DO 05/22/16 2150

## 2016-05-19 NOTE — Discharge Instructions (Signed)
Take over the counter decongestant (such as sudafed), as directed on packaging, for the next week.  Use over the counter normal saline nasal spray, as instructed in the Emergency Department, several times per day for the next 2 weeks. Take the prescription as directed. Take over the counter maalox, as directed on packaging, as needed for discomfort.  Increase your fluid intake (ie:  Gatoraide) for the next few days, as discussed.  Eat a bland diet and advance to your regular diet slowly as you can tolerate it.   Avoid full strength juices, as well as milk and milk products until your diarrhea has resolved.   Call your regular medical doctor today to schedule a follow up appointment next week.  Return to the Emergency Department immediately if not improving (or even worsening) despite taking the medicines as prescribed, any black or bloody stool or vomit, if you develop a fever over "101," or for any other concerns.

## 2016-05-19 NOTE — ED Triage Notes (Signed)
Pt comes in for cough and congestion starting 2 days ago. In addition to this, pt began having generalized stomach pain starting yesterday. She has been unable to keep down any food after eating. States she has had intermittent diarrhea. NAD noted.

## 2017-02-24 ENCOUNTER — Encounter (HOSPITAL_COMMUNITY): Payer: Self-pay | Admitting: Emergency Medicine

## 2017-02-24 ENCOUNTER — Emergency Department (HOSPITAL_COMMUNITY)
Admission: EM | Admit: 2017-02-24 | Discharge: 2017-02-24 | Disposition: A | Discharge status: Home / Self Care | Payer: Medicaid Other | Attending: Emergency Medicine | Admitting: Emergency Medicine

## 2017-02-24 DIAGNOSIS — Z3A01 Less than 8 weeks gestation of pregnancy: Secondary | ICD-10-CM | POA: Diagnosis not present

## 2017-02-24 DIAGNOSIS — O219 Vomiting of pregnancy, unspecified: Secondary | ICD-10-CM

## 2017-02-24 DIAGNOSIS — Z3491 Encounter for supervision of normal pregnancy, unspecified, first trimester: Secondary | ICD-10-CM

## 2017-02-24 LAB — URINALYSIS, ROUTINE W REFLEX MICROSCOPIC
Bilirubin Urine: NEGATIVE
GLUCOSE, UA: NEGATIVE mg/dL
Hgb urine dipstick: NEGATIVE
Ketones, ur: 20 mg/dL — AB
LEUKOCYTES UA: NEGATIVE
NITRITE: NEGATIVE
PROTEIN: NEGATIVE mg/dL
Specific Gravity, Urine: 1.032 — ABNORMAL HIGH (ref 1.005–1.030)
pH: 6 (ref 5.0–8.0)

## 2017-02-24 LAB — PREGNANCY, URINE: PREG TEST UR: POSITIVE — AB

## 2017-02-24 MED ORDER — ONDANSETRON 8 MG PO TBDP
8.0000 mg | ORAL_TABLET | Freq: Once | ORAL | Status: AC
  Administered 2017-02-24: 8 mg via ORAL
  Filled 2017-02-24: qty 1

## 2017-02-24 MED ORDER — ONDANSETRON HCL 4 MG PO TABS: 4.0000 mg | ORAL_TABLET | Freq: Four times a day (QID) | ORAL | 0 refills | Status: DC

## 2017-02-24 NOTE — ED Provider Notes (Signed)
[facility] Provider Note   CSN: [id number] Arrival date & time: 02/24/17  0807     History   Chief Complaint Chief Complaint  Patient presents with  . Nausea    HPI Julie [name] is a 21 y.o. female.  HPI   Julie [name] is a 21 y.o. female who presents to the Emergency Department complaining of nausea and intermittent vomiting for 3 days.  States her symptoms occur mainly in the morning and she is concerned that she may be pregnant.  Here requesting pregnancy test.  She has been unable to tolerate food or liquids.  Denies abdominal pain, diarrhea, fever, chills,  bloody stools or vomitus, dysuria, vaginal bleeding or discharge.  Had nexplanon removed in February.  LMP was 01/26/17.  Reports one previous uncomplicated pregnancy that went to full term.    History reviewed. No pertinent past medical history.  There are no active problems to display for this patient.   History reviewed. No pertinent surgical history.  OB History    Gravida Para Term Preterm AB Living   1         1   SAB TAB Ectopic Multiple Live Births                   Home Medications    Prior to Admission medications   Medication Sig Start Date End Date Taking? Authorizing Provider  etonogestrel (NEXPLANON) 68 MG IMPL implant 1 each by Subdermal route once.    [provider]  Nutritional Supplements (COLD AND FLU PO) Take 2 capsules by mouth every 4 (four) hours as needed (cold).    [provider]  ondansetron (ZOFRAN) 4 MG tablet Take 1 tablet (4 mg total) by mouth every 6 (six) hours. 02/24/17   Pauline Aus, PA-C    Family History History reviewed. No pertinent family history.  Social History Social History  Substance Use Topics  . Smoking status: Never Smoker  . Smokeless tobacco: Never Used  . Alcohol use No     Allergies   Bee venom   Review of Systems Review of Systems  Constitutional: Negative for activity change, appetite change, chills  and fever.  HENT: Negative for congestion and sore throat.   Respiratory: Negative for chest tightness and shortness of breath.   Cardiovascular: Negative for chest pain and leg swelling.  Gastrointestinal: Positive for nausea and vomiting. Negative for abdominal distention, abdominal pain, blood in stool and diarrhea.  Genitourinary: Negative for decreased urine volume, difficulty urinating, frequency, genital sores, vaginal bleeding, vaginal discharge and vaginal pain.  Musculoskeletal: Negative for arthralgias, myalgias, neck pain and neck stiffness.  Skin: Negative for color change and rash.  Neurological: Negative for dizziness, syncope, weakness and headaches.     Physical Exam Updated Vital Signs BP 125/82 (BP Location: Left Arm)   Pulse 98   Temp 98.4 F (36.9 C) (Oral)   Resp 16   Ht 5' 5.5" (1.664 m)   Wt 68.5 kg (151 lb)   LMP 01/26/2017   SpO2 100%   BMI 24.75 kg/m   Physical Exam  Constitutional: She is oriented to person, place, and time. She appears well-developed and well-nourished. No distress.  HENT:  Head: Atraumatic.  Mouth/Throat: Oropharynx is clear and moist.  Eyes: Conjunctivae are normal. Pupils are equal, round, and reactive to light.  Neck: Normal range of motion. Neck supple. No thyromegaly present.  Cardiovascular: Normal rate, regular rhythm, normal heart sounds and intact distal pulses.  No murmur heard. Pulmonary/Chest: Effort normal and breath sounds normal. No respiratory distress.  Abdominal: Soft. She exhibits no distension and no mass. There is no tenderness. There is no rebound and no guarding.  Musculoskeletal: Normal range of motion.  Lymphadenopathy:    She has no cervical adenopathy.  Neurological: She is alert and oriented to person, place, and time. No sensory deficit.  Skin: Skin is warm. Capillary refill takes less than 2 seconds. No rash noted. No erythema.  Psychiatric: She has a normal mood and affect.  Nursing note and  vitals reviewed.    ED Treatments / Results  Labs (all labs ordered are listed, but only abnormal results are displayed) Labs Reviewed  PREGNANCY, URINE - Abnormal; Notable for the following:       Result Value   Preg Test, Ur POSITIVE (*)    All other components within normal limits  URINALYSIS, ROUTINE W REFLEX MICROSCOPIC - Abnormal; Notable for the following:    Specific Gravity, Urine 1.032 (*)    Ketones, ur 20 (*)    All other components within normal limits    EKG  EKG Interpretation None       Radiology No results found.  Procedures Procedures (including critical care time)  Medications Ordered in ED Medications  ondansetron (ZOFRAN-ODT) disintegrating tablet 8 mg (8 mg Oral Given 02/24/17 0900)     Initial Impression / Assessment and Plan / ED Course  I have reviewed the triage vital signs and the nursing notes.  Pertinent labs & imaging results that were available during my care of the patient were reviewed by me and considered in my medical decision making (see chart for details).     Pt is well appearing, vitals stable.  Abdomen is soft, NT, no active vomiting here, no vaginal bleeding.  Mucous membranes are moist, pt does not appear dehydrated.   Nausea improved after ODT zofran.  Tolerating po fluid challenge.  Pt requesting referral info for [facility] to establish prenatal care.  She appears stable for d/c, encouraged to drink fluids,  and return precautions discussed.   Final Clinical Impressions(s) / ED Diagnoses   Final diagnoses:  Pregnant and not yet delivered in first trimester  Nausea and vomiting in pregnancy    New Prescriptions Discharge Medication List as of 02/24/2017 10:42 AM       Pauline Ausriplett, Jarris Kortz, PA-C 02/24/17 1148    Vanetta MuldersZackowski, Scott, MD 03/02/17 1719

## 2017-02-24 NOTE — ED Notes (Signed)
Pt made aware to return if symptoms worsen or if any life threatening symptoms occur.  PT kept sprite down, no vomiting.

## 2017-02-24 NOTE — ED Notes (Signed)
Pt given sprite to drink. 

## 2017-02-24 NOTE — ED Triage Notes (Signed)
PT c/o nausea with some vomiting x3 days. PT denies any abdominal pain, urinary symptoms or vaginal discharge states request for pregnancy test. LMP: 01/26/17.

## 2017-02-24 NOTE — Discharge Instructions (Signed)
Call [facility] on Monday to arrange follow-up care.  Return here for any worsening symptoms such as abdominal pain, fever or vaginal bleeding

## 2017-02-27 ENCOUNTER — Telehealth: Payer: Self-pay | Admitting: *Deleted

## 2017-02-27 NOTE — Telephone Encounter (Signed)
Informed patient that since we have not seen her as a patient before, our providers would not be able to send her in anything until she was seen. Advised to try Vitamin B6 or Unisom as well as eating small frequent meals, crackers by bed, Sea Bands and small liquid sips. Verbalized understanding.

## 2017-02-28 ENCOUNTER — Encounter (HOSPITAL_COMMUNITY): Payer: Self-pay | Admitting: *Deleted

## 2017-02-28 ENCOUNTER — Emergency Department (HOSPITAL_COMMUNITY)
Admission: EM | Admit: 2017-02-28 | Discharge: 2017-02-28 | Disposition: A | Discharge status: Home / Self Care | Payer: Medicaid Other | Attending: Emergency Medicine | Admitting: Emergency Medicine

## 2017-02-28 DIAGNOSIS — Z3A01 Less than 8 weeks gestation of pregnancy: Secondary | ICD-10-CM | POA: Insufficient documentation

## 2017-02-28 DIAGNOSIS — O219 Vomiting of pregnancy, unspecified: Secondary | ICD-10-CM | POA: Diagnosis not present

## 2017-02-28 LAB — COMPREHENSIVE METABOLIC PANEL
ALBUMIN: 4.7 g/dL (ref 3.5–5.0)
ALT: 29 U/L (ref 14–54)
AST: 27 U/L (ref 15–41)
Alkaline Phosphatase: 59 U/L (ref 38–126)
Anion gap: 11 (ref 5–15)
BUN: 8 mg/dL (ref 6–20)
CHLORIDE: 99 mmol/L — AB (ref 101–111)
CO2: 25 mmol/L (ref 22–32)
CREATININE: 0.75 mg/dL (ref 0.44–1.00)
Calcium: 10.3 mg/dL (ref 8.9–10.3)
GFR calc non Af Amer: >60 mL/min (ref >60)
Glucose, Bld: 87 mg/dL (ref 65–99)
Potassium: 3.1 mmol/L — ABNORMAL LOW (ref 3.5–5.1)
SODIUM: 135 mmol/L (ref 135–145)
Total Bilirubin: 0.7 mg/dL (ref 0.3–1.2)
Total Protein: 9.9 g/dL — ABNORMAL HIGH (ref 6.5–8.1)

## 2017-02-28 LAB — CBC
HCT: 41.3 % (ref 36.0–46.0)
Hemoglobin: 14.4 g/dL (ref 12.0–15.0)
MCH: 29.8 pg (ref 26.0–34.0)
MCHC: 34.9 g/dL (ref 30.0–36.0)
MCV: 85.3 fL (ref 78.0–100.0)
PLATELETS: 371 10*3/uL (ref 150–400)
RBC: 4.84 MIL/uL (ref 3.87–5.11)
RDW: 12.4 % (ref 11.5–15.5)
WBC: 10.5 10*3/uL (ref 4.0–10.5)

## 2017-02-28 LAB — URINALYSIS, ROUTINE W REFLEX MICROSCOPIC
Bilirubin Urine: NEGATIVE
Glucose, UA: NEGATIVE mg/dL
Hgb urine dipstick: NEGATIVE
KETONES UR: 80 mg/dL — AB
Leukocytes, UA: NEGATIVE
Nitrite: NEGATIVE
PH: 7 (ref 5.0–8.0)
PROTEIN: 30 mg/dL — AB
Specific Gravity, Urine: 1.029 (ref 1.005–1.030)

## 2017-02-28 LAB — LIPASE, BLOOD: LIPASE: 23 U/L (ref 11–51)

## 2017-02-28 LAB — PREGNANCY, URINE: PREG TEST UR: POSITIVE — AB

## 2017-02-28 MED ORDER — PRENATAL 27-0.8 MG PO TABS: 1.0000 | ORAL_TABLET | Freq: Every day | ORAL | 0 refills | Status: DC

## 2017-02-28 MED ORDER — ONDANSETRON 4 MG PO TBDP
4.0000 mg | ORAL_TABLET | Freq: Once | ORAL | Status: AC
  Administered 2017-02-28: 4 mg via ORAL
  Filled 2017-02-28: qty 1

## 2017-02-28 MED ORDER — PROMETHAZINE HCL 25 MG PO TABS: 25.0000 mg | ORAL_TABLET | Freq: Four times a day (QID) | ORAL | 1 refills | Status: DC | PRN

## 2017-02-28 MED ORDER — PROMETHAZINE HCL 12.5 MG PO TABS
25.0000 mg | ORAL_TABLET | Freq: Once | ORAL | Status: AC
  Administered 2017-02-28: 25 mg via ORAL
  Filled 2017-02-28: qty 2

## 2017-02-28 NOTE — ED Provider Notes (Signed)
[facility] Provider Note   CSN: [id number] Arrival date & time: 02/28/17  1924     History   Chief Complaint Chief Complaint  Patient presents with  . Emesis    HPI Julie [name] is a 21 y.o. female.   Patient early stages of pregnancy. Positive pregnancy test June 23. Patient with persistent nausea and vomiting. No abdominal pain no pelvic pain no vaginal bleeding. Patient states that the Zofran is helping a little bit but she still having difficulty keeping things down. Patient is a gravida 2 para 1. Patient not on prenatal vitamins. Patient will be following up with [facility].      History reviewed. No pertinent past medical history.  There are no active problems to display for this patient.   History reviewed. No pertinent surgical history.  OB History    Gravida Para Term Preterm AB Living   1         1   SAB TAB Ectopic Multiple Live Births                   Home Medications    Prior to Admission medications   Medication Sig Start Date End Date Taking? Authorizing Provider  etonogestrel (NEXPLANON) 68 MG IMPL implant 1 each by Subdermal route once.    [provider]  Nutritional Supplements (COLD AND FLU PO) Take 2 capsules by mouth every 4 (four) hours as needed (cold).    [provider]  ondansetron (ZOFRAN) 4 MG tablet Take 1 tablet (4 mg total) by mouth every 6 (six) hours. 02/24/17   Triplett, Tammy, PA-C  Prenatal Vit-Fe Fumarate-FA (MULTIVITAMIN-PRENATAL) 27-0.8 MG TABS tablet Take 1 tablet by mouth daily at 12 noon. 02/28/17   Vanetta MuldersZackowski, Kennan Detter, MD  promethazine (PHENERGAN) 25 MG tablet Take 1 tablet (25 mg total) by mouth every 6 (six) hours as needed. 02/28/17   Vanetta MuldersZackowski, Taneil Lazarus, MD    Family History No family history on file.  Social History Social History  Substance Use Topics  . Smoking status: Never Smoker  . Smokeless tobacco: Never Used  . Alcohol use No     Allergies   Bee venom   Review of  Systems Review of Systems  Constitutional: Negative for fever.  HENT: Negative for congestion.   Eyes: Negative for redness.  Respiratory: Negative for shortness of breath.   Cardiovascular: Negative for chest pain.  Gastrointestinal: Positive for nausea and vomiting. Negative for abdominal pain.  Genitourinary: Negative for dysuria, pelvic pain, vaginal bleeding and vaginal discharge.  Skin: Negative for rash.  Neurological: Negative for headaches.  Hematological: Does not bruise/bleed easily.  Psychiatric/Behavioral: Negative for confusion.     Physical Exam Updated Vital Signs BP 103/89 (BP Location: Right Arm)   Pulse 100   Temp 98.1 F (36.7 C) (Oral)   Resp 17   Ht 1.664 m (5' 5.5")   Wt 60.8 kg (134 lb 2 oz)   LMP 01/26/2017   SpO2 95%   BMI 21.98 kg/m   Physical Exam  Constitutional: She is oriented to person, place, and time. She appears well-developed and well-nourished. No distress.  HENT:  Head: Normocephalic and atraumatic.  Mouth/Throat: Oropharynx is clear and moist.  Eyes: Conjunctivae and EOM are normal. Pupils are equal, round, and reactive to light.  Neck: Normal range of motion. Neck supple.  Cardiovascular: Normal rate, regular rhythm and normal heart sounds.   Pulmonary/Chest: Effort normal and breath sounds normal. No respiratory distress.  Abdominal:  Soft. Bowel sounds are normal. There is no tenderness.  Musculoskeletal: Normal range of motion. She exhibits no edema.  Neurological: She is alert and oriented to person, place, and time. No cranial nerve deficit. She exhibits normal muscle tone. Coordination normal.  Skin: Skin is warm.  Nursing note and vitals reviewed.    ED Treatments / Results  Labs (all labs ordered are listed, but only abnormal results are displayed) Labs Reviewed  COMPREHENSIVE METABOLIC PANEL - Abnormal; Notable for the following:       Result Value   Potassium 3.1 (*)    Chloride 99 (*)    Total Protein 9.9 (*)     All other components within normal limits  URINALYSIS, ROUTINE W REFLEX MICROSCOPIC - Abnormal; Notable for the following:    APPearance HAZY (*)    Ketones, ur 80 (*)    Protein, ur 30 (*)    Bacteria, UA RARE (*)    Squamous Epithelial / LPF 6-30 (*)    All other components within normal limits  PREGNANCY, URINE - Abnormal; Notable for the following:    Preg Test, Ur POSITIVE (*)    All other components within normal limits  LIPASE, BLOOD  CBC    EKG  EKG Interpretation None       Radiology No results found.  Procedures Procedures (including critical care time)  Medications Ordered in ED Medications  promethazine (PHENERGAN) tablet 25 mg (not administered)  ondansetron (ZOFRAN-ODT) disintegrating tablet 4 mg (not administered)     Initial Impression / Assessment and Plan / ED Course  I have reviewed the triage vital signs and the nursing notes.  Pertinent labs & imaging results that were available during my care of the patient were reviewed by me and considered in my medical decision making (see chart for details).      Patient early pregnancy. Patient had a recent positive for an see test. Patient's had persistent vomiting but no vaginal bleeding no pelvic pain. Patient clinically here not dehydrated. Labs without a sniffing abnormalities. Patient already on Zofran. We'll add Phenergan. Will start prenatal vitamins. We'll have patient follow-up with OB/GYN. Patient will return for any new or worse symptoms.    No concern for ectopic pregnancy or threatened miscarriage based on today's presentation.  Final Clinical Impressions(s) / ED Diagnoses   Final diagnoses:  Less than [redacted] weeks gestation of pregnancy  Vomiting affecting pregnancy    New Prescriptions New Prescriptions   PRENATAL VIT-FE FUMARATE-FA (MULTIVITAMIN-PRENATAL) 27-0.8 MG TABS TABLET    Take 1 tablet by mouth daily at 12 noon.   PROMETHAZINE (PHENERGAN) 25 MG TABLET    Take 1 tablet (25 mg  total) by mouth every 6 (six) hours as needed.     Vanetta Mulders, MD 02/28/17 2252

## 2017-02-28 NOTE — Discharge Instructions (Signed)
Take the Zofran on a regular basis. Can supplement with the Phenergan as needed. Make appointment to follow-up with OB/GYN. Start taking the prenatal vitamins. Return for any vaginal bleeding or pelvic pain. No signs of significant dehydration today.

## 2017-02-28 NOTE — ED Triage Notes (Signed)
Pt c/o n/v, was seen in er recently for same, states that she is not any better,

## 2017-03-13 ENCOUNTER — Other Ambulatory Visit: Payer: Self-pay | Admitting: Obstetrics and Gynecology

## 2017-03-13 DIAGNOSIS — O3680X0 Pregnancy with inconclusive fetal viability, not applicable or unspecified: Secondary | ICD-10-CM

## 2017-03-14 ENCOUNTER — Ambulatory Visit (INDEPENDENT_AMBULATORY_CARE_PROVIDER_SITE_OTHER): Payer: Medicaid Other

## 2017-03-14 DIAGNOSIS — O3680X0 Pregnancy with inconclusive fetal viability, not applicable or unspecified: Secondary | ICD-10-CM

## 2017-03-14 NOTE — Progress Notes (Signed)
US 8+1 wks,single IUP w/ys,pos fht 166 bpm,normal ovaries bilat,crl 17.65 mm,EDD 10/23/2017

## 2017-03-16 ENCOUNTER — Emergency Department (HOSPITAL_COMMUNITY)
Admission: EM | Admit: 2017-03-16 | Discharge: 2017-03-17 | Disposition: A | Discharge status: Home / Self Care | Payer: Medicaid Other | Attending: Emergency Medicine | Admitting: Emergency Medicine

## 2017-03-16 ENCOUNTER — Encounter (HOSPITAL_COMMUNITY): Payer: Self-pay | Admitting: Emergency Medicine

## 2017-03-16 DIAGNOSIS — Z79899 Other long term (current) drug therapy: Secondary | ICD-10-CM | POA: Insufficient documentation

## 2017-03-16 DIAGNOSIS — Z3A01 Less than 8 weeks gestation of pregnancy: Secondary | ICD-10-CM | POA: Diagnosis not present

## 2017-03-16 DIAGNOSIS — O219 Vomiting of pregnancy, unspecified: Secondary | ICD-10-CM | POA: Insufficient documentation

## 2017-03-16 LAB — CBC WITH DIFFERENTIAL/PLATELET
BASOS ABS: 0 10*3/uL (ref 0.0–0.1)
Basophils Relative: 0 %
EOS ABS: 0 10*3/uL (ref 0.0–0.7)
EOS PCT: 0 %
HCT: 39.8 % (ref 36.0–46.0)
Hemoglobin: 14.4 g/dL (ref 12.0–15.0)
LYMPHS PCT: 22 %
Lymphs Abs: 2.3 10*3/uL (ref 0.7–4.0)
MCH: 30.3 pg (ref 26.0–34.0)
MCHC: 36.2 g/dL — ABNORMAL HIGH (ref 30.0–36.0)
MCV: 83.8 fL (ref 78.0–100.0)
MONO ABS: 0.8 10*3/uL (ref 0.1–1.0)
Monocytes Relative: 7 %
Neutro Abs: 7.3 10*3/uL (ref 1.7–7.7)
Neutrophils Relative %: 71 %
PLATELETS: 343 10*3/uL (ref 150–400)
RBC: 4.75 MIL/uL (ref 3.87–5.11)
RDW: 12.2 % (ref 11.5–15.5)
WBC: 10.4 10*3/uL (ref 4.0–10.5)

## 2017-03-16 LAB — COMPREHENSIVE METABOLIC PANEL
ALT: 53 U/L (ref 14–54)
AST: 35 U/L (ref 15–41)
Albumin: 4.4 g/dL (ref 3.5–5.0)
Alkaline Phosphatase: 52 U/L (ref 38–126)
Anion gap: 14 (ref 5–15)
BILIRUBIN TOTAL: 1.5 mg/dL — AB (ref 0.3–1.2)
BUN: 12 mg/dL (ref 6–20)
CHLORIDE: 99 mmol/L — AB (ref 101–111)
CO2: 23 mmol/L (ref 22–32)
Calcium: 10.4 mg/dL — ABNORMAL HIGH (ref 8.9–10.3)
Creatinine, Ser: 0.72 mg/dL (ref 0.44–1.00)
GFR calc Af Amer: >60 mL/min (ref >60)
GFR calc non Af Amer: >60 mL/min (ref >60)
GLUCOSE: 100 mg/dL — AB (ref 65–99)
POTASSIUM: 3.2 mmol/L — AB (ref 3.5–5.1)
Sodium: 136 mmol/L (ref 135–145)
TOTAL PROTEIN: 9.4 g/dL — AB (ref 6.5–8.1)

## 2017-03-16 LAB — URINALYSIS, ROUTINE W REFLEX MICROSCOPIC
Bacteria, UA: NONE SEEN
GLUCOSE, UA: NEGATIVE mg/dL
KETONES UR: 80 mg/dL — AB
LEUKOCYTES UA: NEGATIVE
NITRITE: NEGATIVE
PROTEIN: 100 mg/dL — AB
Specific Gravity, Urine: 1.034 — ABNORMAL HIGH (ref 1.005–1.030)
pH: 5 (ref 5.0–8.0)

## 2017-03-16 LAB — LIPASE, BLOOD: LIPASE: 26 U/L (ref 11–51)

## 2017-03-16 MED ORDER — SODIUM CHLORIDE 0.9 % IV BOLUS (SEPSIS)
1000.0000 mL | Freq: Once | INTRAVENOUS | Status: AC
  Administered 2017-03-16: 1000 mL via INTRAVENOUS

## 2017-03-16 MED ORDER — POTASSIUM CHLORIDE CRYS ER 20 MEQ PO TBCR
40.0000 meq | EXTENDED_RELEASE_TABLET | Freq: Once | ORAL | Status: AC
  Administered 2017-03-16: 40 meq via ORAL
  Filled 2017-03-16: qty 2

## 2017-03-16 MED ORDER — METOCLOPRAMIDE HCL 5 MG/ML IJ SOLN
10.0000 mg | Freq: Once | INTRAMUSCULAR | Status: AC
  Administered 2017-03-16: 10 mg via INTRAVENOUS
  Filled 2017-03-16: qty 2

## 2017-03-16 MED ORDER — FAMOTIDINE IN NACL 20-0.9 MG/50ML-% IV SOLN
20.0000 mg | Freq: Once | INTRAVENOUS | Status: AC
  Administered 2017-03-16: 20 mg via INTRAVENOUS
  Filled 2017-03-16: qty 50

## 2017-03-16 NOTE — ED Triage Notes (Signed)
Patient complaining of vomiting x 3 days. States she is [redacted] weeks pregnant.

## 2017-03-16 NOTE — ED Provider Notes (Signed)
[facility]GENCY DEPT Provider Note   CSN: [id number] Arrival date & time: 03/16/17  2033     History   Chief Complaint Chief Complaint  Patient presents with  . Emesis During Pregnancy    HPI Julie [name] is a 21 y.o. female.  HPI  Pt was seen at 2145. Per pt, c/o gradual onset and persistence of multiple intermittent episodes of N/V for the past 3 days. Pt endorses hx of same, G2P1, LMP 01/26/2017 with EGA [redacted] weeks. Pt states she has been taking zofran and phenergan without improvement of her N/V. Denies diarrhea, no abd pain, no back pain, no dysuria/hematuria, no fevers, no rash, no pelvic pain, no vaginal bleeding/discharge.    [facility] History reviewed. No pertinent past medical history.  There are no active problems to display for this patient.   History reviewed. No pertinent surgical history.  OB History    Gravida Para Term Preterm AB Living   2         1   SAB TAB Ectopic Multiple Live Births                   Home Medications    Prior to Admission medications   Medication Sig Start Date End Date Taking? Authorizing Provider  etonogestrel (NEXPLANON) 68 MG IMPL implant 1 each by Subdermal route once.    [provider]  Nutritional Supplements (COLD AND FLU PO) Take 2 capsules by mouth every 4 (four) hours as needed (cold).    [provider]  ondansetron (ZOFRAN) 4 MG tablet Take 1 tablet (4 mg total) by mouth every 6 (six) hours. 02/24/17   Triplett, Tammy, PA-C  Prenatal Vit-Fe Fumarate-FA (MULTIVITAMIN-PRENATAL) 27-0.8 MG TABS tablet Take 1 tablet by mouth daily at 12 noon. 02/28/17   Vanetta Mulders, MD  promethazine (PHENERGAN) 25 MG tablet Take 1 tablet (25 mg total) by mouth every 6 (six) hours as needed. 02/28/17   Vanetta Mulders, MD    Family History History reviewed. No pertinent family history.  Social History Social History  Substance Use Topics  . Smoking status: Never Smoker  . Smokeless tobacco: Never  Used  . Alcohol use No     Allergies   Bee venom   Review of Systems Review of Systems ROS: Statement: All systems negative except as marked or noted in the HPI; Constitutional: Negative for fever and chills. ; ; Eyes: Negative for eye pain, redness and discharge. ; ; ENMT: Negative for ear pain, hoarseness, nasal congestion, sinus pressure and sore throat. ; ; Cardiovascular: Negative for chest pain, palpitations, diaphoresis, dyspnea and peripheral edema. ; ; Respiratory: Negative for cough, wheezing and stridor. ; ; Gastrointestinal: +N/V, abd pain. Negative for diarrhea, blood in stool, hematemesis, jaundice and rectal bleeding. . ; ; Genitourinary: Negative for dysuria, flank pain and hematuria. ; ; GYN:  No pelvic pain, no vaginal bleeding, no vaginal discharge, no vulvar pain. ;; Musculoskeletal: Negative for back pain and neck pain. Negative for swelling and trauma.; ; Skin: Negative for pruritus, rash, abrasions, blisters, bruising and skin lesion.; ; Neuro: Negative for headache, lightheadedness and neck stiffness. Negative for weakness, altered level of consciousness, altered mental status, extremity weakness, paresthesias, involuntary movement, seizure and syncope.       Physical Exam Updated Vital Signs BP (!) 130/96 (BP Location: Right Arm)   Pulse (!) 135   Temp 97.6 F (36.4 C) (Oral)   Resp 19   Ht 5\' 5"  (1.651 m)  Wt 60.8 kg (134 lb)   LMP 01/26/2017 (Exact Date)   SpO2 99%   BMI 22.30 kg/m   Patient Vitals for the past 24 hrs:  BP Temp Temp src Pulse Resp SpO2 Height Weight  03/17/17 0001 (!) 106/57 - - (!) 115 18 100 % - -  03/16/17 2246 114/71 98.2 F (36.8 C) Oral (!) 105 (!) 22 100 % - -  03/16/17 2043 (!) 130/96 97.6 F (36.4 C) Oral (!) 135 19 99 % 5\' 5"  (1.651 m) 60.8 kg (134 lb)  '   Physical Exam 2150: Physical examination:  Nursing notes reviewed; Vital signs and O2 SAT reviewed;  Constitutional: Well developed, Well nourished, Well hydrated,  Uncomfortable appearing.; Head:  Normocephalic, atraumatic; Eyes: EOMI, PERRL, No scleral icterus; ENMT: Mouth and pharynx normal, Mucous membranes moist; Neck: Supple, Full range of motion, No lymphadenopathy; Cardiovascular: Tachycardic rate and rhythm, No gallop; Respiratory: Breath sounds clear & equal bilaterally, No wheezes.  Speaking full sentences with ease, Normal respiratory effort/excursion; Chest: Nontender, Movement normal; Abdomen: Soft, Nontender, Nondistended, Normal bowel sounds; Genitourinary: No CVA tenderness; Extremities: Pulses normal, No tenderness, No edema, No calf edema or asymmetry.; Neuro: AA&Ox3, Major CN grossly intact.  Speech clear. No gross focal motor or sensory deficits in extremities.; Skin: Color normal, Warm, Dry.   ED Treatments / Results  Labs (all labs ordered are listed, but only abnormal results are displayed)   EKG  EKG Interpretation None       Radiology   Procedures Procedures (including critical care time)  Medications Ordered in ED Medications  famotidine (PEPCID) IVPB 20 mg premix (20 mg Intravenous New Bag/Given 03/16/17 2208)  sodium chloride 0.9 % bolus 1,000 mL (1,000 mLs Intravenous New Bag/Given 03/16/17 2206)  metoCLOPramide (REGLAN) injection 10 mg (10 mg Intravenous Given 03/16/17 2209)     Initial Impression / Assessment and Plan / ED Course  I have reviewed the triage vital signs and the nursing notes.  Pertinent labs & imaging results that were available during my care of the patient were reviewed by me and considered in my medical decision making (see chart for details).  MDM Reviewed: previous chart, nursing note and vitals Reviewed previous: labs Interpretation: labs   Results for orders placed or performed during the hospital encounter of 03/16/17  Urinalysis, Routine w reflex microscopic  Result Value Ref Range   Color, Urine AMBER (A) YELLOW   APPearance HAZY (A) CLEAR   Specific Gravity, Urine 1.034 (H)  1.005 - 1.030   pH 5.0 5.0 - 8.0   Glucose, UA NEGATIVE NEGATIVE mg/dL   Hgb urine dipstick SMALL (A) NEGATIVE   Bilirubin Urine SMALL (A) NEGATIVE   Ketones, ur 80 (A) NEGATIVE mg/dL   Protein, ur 782 (A) NEGATIVE mg/dL   Nitrite NEGATIVE NEGATIVE   Leukocytes, UA NEGATIVE NEGATIVE   RBC / HPF 6-30 0 - 5 RBC/hpf   WBC, UA 6-30 0 - 5 WBC/hpf   Bacteria, UA NONE SEEN NONE SEEN   Squamous Epithelial / LPF 0-5 (A) NONE SEEN   Mucous PRESENT   CBC with Differential  Result Value Ref Range   WBC 10.4 4.0 - 10.5 K/uL   RBC 4.75 3.87 - 5.11 MIL/uL   Hemoglobin 14.4 12.0 - 15.0 g/dL   HCT 95.6 21.3 - 08.6 %   MCV 83.8 78.0 - 100.0 fL   MCH 30.3 26.0 - 34.0 pg   MCHC 36.2 (H) 30.0 - 36.0 g/dL   RDW 57.8 46.9 -  15.5 %   Platelets 343 150 - 400 K/uL   Neutrophils Relative % 71 %   Neutro Abs 7.3 1.7 - 7.7 K/uL   Lymphocytes Relative 22 %   Lymphs Abs 2.3 0.7 - 4.0 K/uL   Monocytes Relative 7 %   Monocytes Absolute 0.8 0.1 - 1.0 K/uL   Eosinophils Relative 0 %   Eosinophils Absolute 0.0 0.0 - 0.7 K/uL   Basophils Relative 0 %   Basophils Absolute 0.0 0.0 - 0.1 K/uL  Lipase, blood  Result Value Ref Range   Lipase 26 11 - 51 U/L  Comprehensive metabolic panel  Result Value Ref Range   Sodium 136 135 - 145 mmol/L   Potassium 3.2 (L) 3.5 - 5.1 mmol/L   Chloride 99 (L) 101 - 111 mmol/L   CO2 23 22 - 32 mmol/L   Glucose, Bld 100 (H) 65 - 99 mg/dL   BUN 12 6 - 20 mg/dL   Creatinine, Ser 1.610.72 0.44 - 1.00 mg/dL   Calcium 09.610.4 (H) 8.9 - 10.3 mg/dL   Total Protein 9.4 (H) 6.5 - 8.1 g/dL   Albumin 4.4 3.5 - 5.0 g/dL   AST 35 15 - 41 U/L   ALT 53 14 - 54 U/L   Alkaline Phosphatase 52 38 - 126 U/L   Total Bilirubin 1.5 (H) 0.3 - 1.2 mg/dL   GFR calc non Af Amer >60 >60 mL/min   GFR calc Af Amer >60 >60 mL/min   Anion gap 14 5 - 15    0010:  No clear UTI on Udip; UC pending. Pt has tol PO well without N/V. Potassium repleted PO. States she "feels better now" and wants to go home. NAD,  resps easy, abd remains benign. I offered pt another 1L NS IVF; pt refuses and wants to leave now; encouraged PO fluids. Dx and testing d/w pt.  Questions answered.  Verb understanding, agreeable to d/c home with outpt f/u.    Final Clinical Impressions(s) / ED Diagnoses   Final diagnoses:  None    New Prescriptions New Prescriptions   No medications on file     Samuel JesterMcManus, Christopher Hink, DO 03/21/17 [id number]

## 2017-03-17 MED ORDER — METOCLOPRAMIDE HCL 10 MG PO TABS: 10.0000 mg | ORAL_TABLET | Freq: Three times a day (TID) | ORAL | 0 refills | Status: DC | PRN

## 2017-03-17 NOTE — ED Notes (Signed)
Pt states understanding of care given and follow up instructions.  Pt a/o ambulated from ED with steady gait 

## 2017-03-17 NOTE — Discharge Instructions (Signed)
Take the new prescription as directed.  Increase your fluid intake (ie:  Gatoraide) for the next few days.  Eat a bland diet and advance to your regular diet slowly as you can tolerate it.  Call your regular OB/GYN doctor Monday to schedule a follow up appointment in the next 3 days. Return to the Emergency Department immediately if not improving (or even worsening) despite taking the medicines as prescribed, any black or bloody stool or vomit, if you develop a fever over "101," or for any other concerns.

## 2017-03-18 LAB — URINE CULTURE: Culture: NO GROWTH

## 2017-03-23 ENCOUNTER — Telehealth: Payer: Self-pay | Admitting: Women's Health
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
## 2017-03-27 ENCOUNTER — Telehealth: Payer: Self-pay | Admitting: Women's Health

## 2017-03-27 NOTE — Telephone Encounter (Signed)
Unable to leave message. VM full. Note is in file.

## 2017-04-06 ENCOUNTER — Encounter: Payer: Self-pay | Admitting: Women's Health

## 2017-04-06 ENCOUNTER — Ambulatory Visit (INDEPENDENT_AMBULATORY_CARE_PROVIDER_SITE_OTHER): Payer: Medicaid Other | Admitting: Women's Health

## 2017-04-06 ENCOUNTER — Ambulatory Visit: Payer: Self-pay | Admitting: *Deleted

## 2017-04-06 VITALS — BP 124/58 | HR 88 | Wt 124.6 lb

## 2017-04-06 DIAGNOSIS — Z331 Pregnant state, incidental: Secondary | ICD-10-CM

## 2017-04-06 DIAGNOSIS — Z3A11 11 weeks gestation of pregnancy: Secondary | ICD-10-CM | POA: Diagnosis not present

## 2017-04-06 DIAGNOSIS — Z3481 Encounter for supervision of other normal pregnancy, first trimester: Secondary | ICD-10-CM | POA: Diagnosis not present

## 2017-04-06 DIAGNOSIS — Z3682 Encounter for antenatal screening for nuchal translucency: Secondary | ICD-10-CM

## 2017-04-06 DIAGNOSIS — Z3490 Encounter for supervision of normal pregnancy, unspecified, unspecified trimester: Secondary | ICD-10-CM | POA: Insufficient documentation

## 2017-04-06 DIAGNOSIS — Z1389 Encounter for screening for other disorder: Secondary | ICD-10-CM

## 2017-04-06 DIAGNOSIS — Z124 Encounter for screening for malignant neoplasm of cervix: Secondary | ICD-10-CM

## 2017-04-06 DIAGNOSIS — K117 Disturbances of salivary secretion: Secondary | ICD-10-CM | POA: Insufficient documentation

## 2017-04-06 LAB — POCT URINALYSIS DIPSTICK
GLUCOSE UA: NEGATIVE
Ketones, UA: NEGATIVE
Leukocytes, UA: NEGATIVE
Nitrite, UA: NEGATIVE

## 2017-04-06 MED ORDER — GLYCOPYRROLATE 2 MG PO TABS: 2.0000 mg | ORAL_TABLET | Freq: Two times a day (BID) | ORAL | 3 refills | Status: DC

## 2017-04-06 NOTE — Patient Instructions (Signed)

## 2017-04-06 NOTE — Progress Notes (Signed)
  Subjective:  Julie [name] is a 21 y.o. 482P1001 African American female at 6049w3d by 8wk u/s, being seen today for her first obstetrical visit.  Her obstetrical history is significant for [facility].  Pregnancy history fully reviewed.  Patient reports 'extra saliva', has to carry cup around. Denies vb, cramping, uti s/s, abnormal/malodorous vag d/c, or vulvovaginal itching/irritation.  BP (!) 124/58   Pulse 88   Wt 124 lb 9.6 oz (56.5 kg)   LMP 01/26/2017 (Exact Date)   BMI 20.73 kg/m   HISTORY: OB History  Gravida Para Term Preterm AB Living  2 1 1     1   SAB TAB Ectopic Multiple Live Births          1    # Outcome Date GA Lbr Len/2nd Weight Sex Delivery Anes PTL Lv  2 Current           1 Term 08/29/12 5670w0d  6 lb 3 oz (2.807 kg) F Vag-Spont None N LIV     Birth Comments: System Generated. Please review and update pregnancy details.     Past Medical History:  Diagnosis Date  . Medical history non-contributory    Past Surgical History:  Procedure Laterality Date  . NO PAST SURGERIES     Family History  Problem Relation Age of Onset  . Diabetes Paternal Grandmother     Exam   System:     General: Well developed & nourished, no acute distress   Skin: Warm & dry, normal coloration and turgor, no rashes   Neurologic: Alert & oriented, normal mood   Cardiovascular: Regular rate & rhythm   Respiratory: Effort & rate normal, LCTAB, acyanotic   Abdomen: Soft, non tender   Extremities: normal strength, tone  Thin prep pap smear neg 2018 @ [facility]  FHR: 166 via doppler   Assessment:   Pregnancy: G2P1001 Patient Active Problem List   Diagnosis Date Noted  . Supervision of normal pregnancy 04/06/2017    649w3d G2P1001 New OB visit Ptyalism  Plan:  Initial labs obtained Continue prenatal vitamins Problem list reviewed and updated Reviewed n/v relief measures and warning s/s to report Reviewed recommended weight gain based on pre-gravid  BMI Encouraged well-balanced diet Genetic Screening discussed Integrated Screen: requested Cystic fibrosis screening discussed requested Ultrasound discussed; fetal survey: requested Follow up in 2 weeks for 1st IT/NT and visit [facility] completed Rx robinul 2mg  BID for ptyalism  Julie [name], Julie [name] CNM, WHNP-BC 04/06/2017 12:00 PM

## 2017-04-07 LAB — MED LIST OPTION NOT SELECTED

## 2017-04-08 LAB — CBC
Hematocrit: 31.9 % — ABNORMAL LOW (ref 34.0–46.6)
Hemoglobin: 11.1 g/dL (ref 11.1–15.9)
MCH: 29.5 pg (ref 26.6–33.0)
MCHC: 34.8 g/dL (ref 31.5–35.7)
MCV: 85 fL (ref 79–97)
Platelets: 292 10*3/uL (ref 150–379)
RBC: 3.76 x10E6/uL — ABNORMAL LOW (ref 3.77–5.28)
RDW: 13.6 % (ref 12.3–15.4)
WBC: 6.4 10*3/uL (ref 3.4–10.8)

## 2017-04-08 LAB — MICROSCOPIC EXAMINATION
BACTERIA UA: NONE SEEN
CASTS: NONE SEEN /LPF

## 2017-04-08 LAB — URINALYSIS, ROUTINE W REFLEX MICROSCOPIC
Bilirubin, UA: NEGATIVE
Glucose, UA: NEGATIVE
Ketones, UA: NEGATIVE
LEUKOCYTES UA: NEGATIVE
Nitrite, UA: NEGATIVE
PH UA: 6 (ref 5.0–7.5)
Specific Gravity, UA: >=1.03 — AB (ref 1.005–1.030)
UUROB: 1 mg/dL (ref 0.2–1.0)

## 2017-04-08 LAB — ABO/RH: RH TYPE: POSITIVE

## 2017-04-08 LAB — URINE CULTURE

## 2017-04-08 LAB — SICKLE CELL SCREEN: SICKLE CELL SCREEN: NEGATIVE

## 2017-04-08 LAB — HEPATITIS B SURFACE ANTIGEN: HEP B S AG: NEGATIVE

## 2017-04-08 LAB — RUBELLA SCREEN: RUBELLA: 1.46 {index} (ref >0.99)

## 2017-04-08 LAB — ANTIBODY SCREEN: ANTIBODY SCREEN: NEGATIVE

## 2017-04-08 LAB — HIV ANTIBODY (ROUTINE TESTING W REFLEX): HIV SCREEN 4TH GENERATION: NONREACTIVE

## 2017-04-08 LAB — RPR: RPR Ser Ql: NONREACTIVE

## 2017-04-08 LAB — VARICELLA ZOSTER ANTIBODY, IGG: Varicella zoster IgG: <135 index — ABNORMAL LOW (ref >165)

## 2017-04-09 ENCOUNTER — Encounter: Payer: Self-pay | Admitting: Women's Health

## 2017-04-09 DIAGNOSIS — F1290 Cannabis use, unspecified, uncomplicated: Secondary | ICD-10-CM | POA: Insufficient documentation

## 2017-04-09 DIAGNOSIS — O09899 Supervision of other high risk pregnancies, unspecified trimester: Secondary | ICD-10-CM | POA: Insufficient documentation

## 2017-04-09 DIAGNOSIS — Z283 Underimmunization status: Secondary | ICD-10-CM

## 2017-04-09 DIAGNOSIS — Z2839 Other underimmunization status: Secondary | ICD-10-CM | POA: Insufficient documentation

## 2017-04-09 LAB — PMP SCREEN PROFILE (10S), URINE
AMPHETAMINE SCREEN URINE: NEGATIVE ng/mL
BARBITURATE SCREEN URINE: NEGATIVE ng/mL
BENZODIAZEPINE SCREEN, URINE: NEGATIVE ng/mL
CANNABINOIDS UR QL SCN: POSITIVE ng/mL — AB
CREATININE(CRT), U: 359.6 mg/dL — AB (ref 20.0–300.0)
Cocaine (Metab) Scrn, Ur: NEGATIVE ng/mL
METHADONE SCREEN, URINE: NEGATIVE ng/mL
OXYCODONE+OXYMORPHONE UR QL SCN: NEGATIVE ng/mL
Opiate Scrn, Ur: NEGATIVE ng/mL
PH UR, DRUG SCRN: 5.9 (ref 4.5–8.9)
PHENCYCLIDINE QUANTITATIVE URINE: NEGATIVE ng/mL
PROPOXYPHENE SCREEN URINE: NEGATIVE ng/mL

## 2017-04-09 LAB — GC/CHLAMYDIA PROBE AMP
Chlamydia trachomatis, NAA: NEGATIVE
Neisseria gonorrhoeae by PCR: NEGATIVE

## 2017-04-15 ENCOUNTER — Emergency Department (HOSPITAL_COMMUNITY)
Admission: EM | Admit: 2017-04-15 | Discharge: 2017-04-15 | Disposition: A | Discharge status: Home / Self Care | Payer: Medicaid Other | Attending: Emergency Medicine | Admitting: Emergency Medicine

## 2017-04-15 ENCOUNTER — Emergency Department (HOSPITAL_COMMUNITY): Payer: Medicaid Other

## 2017-04-15 ENCOUNTER — Encounter (HOSPITAL_COMMUNITY): Payer: Self-pay | Admitting: Emergency Medicine

## 2017-04-15 DIAGNOSIS — N760 Acute vaginitis: Secondary | ICD-10-CM

## 2017-04-15 DIAGNOSIS — Z3A01 Less than 8 weeks gestation of pregnancy: Secondary | ICD-10-CM | POA: Diagnosis not present

## 2017-04-15 DIAGNOSIS — O9989 Other specified diseases and conditions complicating pregnancy, childbirth and the puerperium: Secondary | ICD-10-CM | POA: Diagnosis not present

## 2017-04-15 DIAGNOSIS — R102 Pelvic and perineal pain: Secondary | ICD-10-CM

## 2017-04-15 DIAGNOSIS — N390 Urinary tract infection, site not specified: Secondary | ICD-10-CM

## 2017-04-15 DIAGNOSIS — O23591 Infection of other part of genital tract in pregnancy, first trimester: Secondary | ICD-10-CM | POA: Insufficient documentation

## 2017-04-15 DIAGNOSIS — O26899 Other specified pregnancy related conditions, unspecified trimester: Secondary | ICD-10-CM

## 2017-04-15 DIAGNOSIS — B9689 Other specified bacterial agents as the cause of diseases classified elsewhere: Secondary | ICD-10-CM

## 2017-04-15 LAB — WET PREP, GENITAL
Sperm: NONE SEEN
TRICH WET PREP: NONE SEEN
Yeast Wet Prep HPF POC: NONE SEEN

## 2017-04-15 LAB — URINALYSIS, ROUTINE W REFLEX MICROSCOPIC
Glucose, UA: NEGATIVE mg/dL
Hgb urine dipstick: NEGATIVE
KETONES UR: 5 mg/dL — AB
Leukocytes, UA: NEGATIVE
NITRITE: NEGATIVE
PROTEIN: 100 mg/dL — AB
Specific Gravity, Urine: 1.029 (ref 1.005–1.030)
pH: 6 (ref 5.0–8.0)

## 2017-04-15 MED ORDER — METRONIDAZOLE 500 MG PO TABS: 500.0000 mg | ORAL_TABLET | Freq: Two times a day (BID) | ORAL | 0 refills | Status: DC

## 2017-04-15 MED ORDER — CEPHALEXIN 500 MG PO CAPS: 500.0000 mg | ORAL_CAPSULE | Freq: Four times a day (QID) | ORAL | 0 refills | Status: DC

## 2017-04-15 NOTE — ED Notes (Signed)
[place] [employer] to pt bedside

## 2017-04-15 NOTE — ED Notes (Signed)
Pt is tearful yet calm- She reports that she has a safe place to go- her mother's home. She also has a 22 year old daughter who she reports is safe

## 2017-04-15 NOTE — Discharge Instructions (Signed)
Your ultrasound today showed a single intrauterine pregnancy appoximately 13 weeks and 341 days old.  Avoid strenuous activity and do NOT place anything into your vagina, ie: no douching, no tampons, no sexual intercourse, no swimming or tub baths, until you are seen in follow up by your regular OB/GYN doctor. Take the prescriptions as directed. Call your regular OB/GYN doctor tomorrow morning to schedule a follow up appointment within the next 3 days.  Return to the Emergency Department immediately if worsening.

## 2017-04-15 NOTE — ED Notes (Signed)
Pt left department prior to receiving d/c paperwork. RN brought paperwork to pt in parking lot. Nad noted.

## 2017-04-15 NOTE — ED Notes (Signed)
Pt in radiology 
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
## 2017-04-15 NOTE — ED Notes (Signed)
From radiology 
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
## 2017-04-16 LAB — GC/CHLAMYDIA PROBE AMP (~~LOC~~) NOT AT ARMC
Chlamydia: NEGATIVE
Neisseria Gonorrhea: NEGATIVE

## 2017-04-16 LAB — CYSTIC FIBROSIS MUTATION 97: Interpretation: NOT DETECTED

## 2017-04-17 LAB — URINE CULTURE: CULTURE: NO GROWTH

## 2017-04-20 ENCOUNTER — Ambulatory Visit (INDEPENDENT_AMBULATORY_CARE_PROVIDER_SITE_OTHER): Payer: Medicaid Other | Admitting: Obstetrics & Gynecology

## 2017-04-20 ENCOUNTER — Encounter: Payer: Self-pay | Admitting: Obstetrics & Gynecology

## 2017-04-20 ENCOUNTER — Ambulatory Visit (INDEPENDENT_AMBULATORY_CARE_PROVIDER_SITE_OTHER): Payer: Medicaid Other

## 2017-04-20 VITALS — BP 104/58 | HR 84 | Wt 129.0 lb

## 2017-04-20 DIAGNOSIS — Z3401 Encounter for supervision of normal first pregnancy, first trimester: Secondary | ICD-10-CM

## 2017-04-20 DIAGNOSIS — Z3682 Encounter for antenatal screening for nuchal translucency: Secondary | ICD-10-CM | POA: Diagnosis not present

## 2017-04-20 DIAGNOSIS — Z331 Pregnant state, incidental: Secondary | ICD-10-CM

## 2017-04-20 DIAGNOSIS — Z3481 Encounter for supervision of other normal pregnancy, first trimester: Secondary | ICD-10-CM

## 2017-04-20 DIAGNOSIS — Z1389 Encounter for screening for other disorder: Secondary | ICD-10-CM

## 2017-04-20 DIAGNOSIS — Z3A13 13 weeks gestation of pregnancy: Secondary | ICD-10-CM

## 2017-04-20 DIAGNOSIS — Z3482 Encounter for supervision of other normal pregnancy, second trimester: Secondary | ICD-10-CM

## 2017-04-20 LAB — POCT URINALYSIS DIPSTICK
Blood, UA: NEGATIVE
Glucose, UA: NEGATIVE
Ketones, UA: NEGATIVE
Leukocytes, UA: NEGATIVE
Nitrite, UA: NEGATIVE
Protein, UA: NEGATIVE

## 2017-04-20 NOTE — Progress Notes (Signed)
[place] 13+3 wks,measurements c/w dates,normal ovaries bilat,fhr 166 bpm,posterior pl gr 0,crl 76.1 mm,NB present,NT 1.7 mm

## 2017-04-20 NOTE — Progress Notes (Signed)
G2P1001 [redacted]w[redacted]d Estimated Date of Delivery: 10/23/17  Blood pressure (!) 104/58, pulse 84, weight 129 lb (58.5 kg), last menstrual period 01/26/2017.   BP weight and urine results all reviewed and noted.  Please refer to the obstetrical flow sheet for the fundal height and fetal heart rate documentation:  Patient reports good fetal movement, denies any bleeding and no rupture of membranes symptoms or regular contractions. Patient is without complaints. All questions were answered.  Orders Placed This Encounter  Procedures  . Maternal Screen, Integrated #1  . POCT urinalysis dipstick    Plan:  Continued routine obstetrical care, NT normal today 1st IT today  Return in about 4 weeks (around 05/18/2017) for LROB, 2nd IT.

## 2017-04-25 LAB — MATERNAL SCREEN, INTEGRATED #1
CROWN RUMP LENGTH MAT SCREEN: 76.1 mm
GEST. AGE ON COLLECTION DATE: 13.1 wk
MATERNAL AGE AT EDD: 22.9 a
NUCHAL TRANSLUCENCY (NT): 1.7 mm
NUMBER OF FETUSES: 1
PAPP-A Value: 1752.1 ng/mL
WEIGHT: 129 [lb_av]

## 2017-05-15 ENCOUNTER — Encounter: Payer: Self-pay | Admitting: Women's Health

## 2017-05-18 ENCOUNTER — Encounter: Payer: Self-pay | Admitting: Women's Health

## 2017-05-18 ENCOUNTER — Ambulatory Visit (INDEPENDENT_AMBULATORY_CARE_PROVIDER_SITE_OTHER): Payer: Medicaid Other | Admitting: Women's Health

## 2017-05-18 VITALS — BP 102/60 | HR 89 | Wt 131.0 lb

## 2017-05-18 DIAGNOSIS — Z3482 Encounter for supervision of other normal pregnancy, second trimester: Secondary | ICD-10-CM

## 2017-05-18 DIAGNOSIS — Z1379 Encounter for other screening for genetic and chromosomal anomalies: Secondary | ICD-10-CM

## 2017-05-18 DIAGNOSIS — Z331 Pregnant state, incidental: Secondary | ICD-10-CM

## 2017-05-18 DIAGNOSIS — Z1389 Encounter for screening for other disorder: Secondary | ICD-10-CM

## 2017-05-18 DIAGNOSIS — Z3A17 17 weeks gestation of pregnancy: Secondary | ICD-10-CM

## 2017-05-18 DIAGNOSIS — Z23 Encounter for immunization: Secondary | ICD-10-CM | POA: Diagnosis not present

## 2017-05-18 DIAGNOSIS — Z363 Encounter for antenatal screening for malformations: Secondary | ICD-10-CM

## 2017-05-18 LAB — POCT URINALYSIS DIPSTICK
GLUCOSE UA: NEGATIVE
Leukocytes, UA: NEGATIVE
Nitrite, UA: NEGATIVE
Protein, UA: NEGATIVE

## 2017-05-18 NOTE — Patient Instructions (Signed)

## 2017-05-18 NOTE — Progress Notes (Signed)
Low-risk OB appointment G2P1001 [redacted]w[redacted]d Estimated Date of Delivery: 10/23/17 BP 102/60   Pulse 89   Wt 131 lb (59.4 kg)   LMP 01/26/2017 (Exact Date)   BMI 21.80 kg/m   BP, weight, and urine reviewed.  Refer to obstetrical flow sheet for FH & FHR.  Some fm! Denies cramping, lof, vb, or uti s/s. No complaints. Reviewed warning s/s to report. Plan:  Continue routine obstetrical care  F/U in 1wk for anatomy u/s (no visit), then 3wks for OB appointment  2nd IT and flu shot today

## 2017-05-22 LAB — INTEGRATED 2
AFP MARKER: 51.1 ng/mL
AFP MoM: 1.19
CROWN RUMP LENGTH: 76.1 mm
DIA MOM: 0.74
DIA Value: 133 pg/mL
Estriol, Unconjugated: 1.19 ng/mL
GEST. AGE ON COLLECTION DATE: 13.1 wk
GESTATIONAL AGE: 17.1 wk
Maternal Age at EDD: 22.9 yr
NUCHAL TRANSLUCENCY MOM: 0.91
Nuchal Translucency (NT): 1.7 mm
Number of Fetuses: 1
PAPP-A MOM: 1.24
PAPP-A Value: 1752.1 ng/mL
TEST RESULTS: NEGATIVE
WEIGHT: 129 [lb_av]
Weight: 131 [lb_av]
hCG MoM: 0.5
hCG Value: 14.8 IU/mL
uE3 MoM: 1.18

## 2017-05-25 ENCOUNTER — Ambulatory Visit (INDEPENDENT_AMBULATORY_CARE_PROVIDER_SITE_OTHER): Payer: Medicaid Other

## 2017-05-25 DIAGNOSIS — Z363 Encounter for antenatal screening for malformations: Secondary | ICD-10-CM

## 2017-05-25 DIAGNOSIS — Z3402 Encounter for supervision of normal first pregnancy, second trimester: Secondary | ICD-10-CM

## 2017-05-25 NOTE — Progress Notes (Signed)
[place] 18+3 wks,breech,cx 3.7 cm,post pl gr 0,normal ovaries bilat,svp of fluid 5.6 cm,fhr 147 bpm,efw 256 g,anatomy complete,no obvious abnormalities

## 2017-06-08 ENCOUNTER — Encounter: Payer: Self-pay | Admitting: Women's Health

## 2017-06-08 ENCOUNTER — Ambulatory Visit (INDEPENDENT_AMBULATORY_CARE_PROVIDER_SITE_OTHER): Payer: Medicaid Other | Admitting: Women's Health

## 2017-06-08 VITALS — BP 110/60 | HR 78 | Wt 131.0 lb

## 2017-06-08 DIAGNOSIS — Z3A20 20 weeks gestation of pregnancy: Secondary | ICD-10-CM

## 2017-06-08 DIAGNOSIS — Z331 Pregnant state, incidental: Secondary | ICD-10-CM

## 2017-06-08 DIAGNOSIS — Z1389 Encounter for screening for other disorder: Secondary | ICD-10-CM

## 2017-06-08 DIAGNOSIS — Z3482 Encounter for supervision of other normal pregnancy, second trimester: Secondary | ICD-10-CM

## 2017-06-08 LAB — POCT URINALYSIS DIPSTICK
Blood, UA: NEGATIVE
GLUCOSE UA: NEGATIVE
Ketones, UA: NEGATIVE
LEUKOCYTES UA: NEGATIVE
Nitrite, UA: NEGATIVE
Protein, UA: NEGATIVE

## 2017-06-08 NOTE — Patient Instructions (Signed)

## 2017-06-08 NOTE — Progress Notes (Signed)
   [facility] Low-Risk Pregnancy Visit  Patient name: Julie [name] MRN [id number]  Date of birth: Jan 31, 1996 CC & HPI:  Julie [name] is a 21 y.o. G2P1001 female at [redacted]w[redacted]d with an Estimated Date of Delivery: 10/23/17 being seen today for ongoing management of a low-risk pregnancy.  Today she reports no complaints. Last THC- thought she was eating gummy works 2wks ago her dad brought from [place], however they were Cannabis gummy works-none since Review of Systems:   Reports good fm.   Denies regular contractions, leakage of fluid, vaginal bleeding, abnormal vaginal discharge w/ itching/odor/irritation, headaches, visual changes, shortness of breath, chest pain, abdominal pain, severe nausea/vomiting, or problems with urination or bowel movements unless otherwise stated above. Pertinent History Reviewed:  Reviewed past medical,surgical, social and family history.  Reviewed problem list, medications and allergies. Objective Findings:   Vitals:   06/08/17 0944  BP: 110/60  Pulse: 78  Weight: 131 lb (59.4 kg)    Body mass index is 21.8 kg/m.  Fundal height: 20wks Fetal heart rate: 166 SVE: n/a  Results for orders placed or performed in visit on 06/08/17 (from the past 24 hour(s))  POCT urinalysis dipstick   Collection Time: 06/08/17  9:48 AM  Result Value Ref Range   Color, UA     Clarity, UA     Glucose, UA neg    Bilirubin, UA     Ketones, UA neg    Spec Grav, UA  1.010 - 1.025   Blood, UA neg    pH, UA  5.0 - 8.0   Protein, UA neg    Urobilinogen, UA  0.2 or 1.0 E.U./dL   Nitrite, UA neg    Leukocytes, UA Negative Negative    Assessment & Plan:  1) Low-risk pregnancy G2P1001 at [redacted]w[redacted]d with an Estimated Date of Delivery: 10/23/17  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 All questions were answered Plan:  Continue routine obstetrical  care   Return in about 4 weeks (around 07/06/2017) for LROB.  Orders Placed This Encounter  Procedures  . POCT urinalysis dipstick   Marge Duncans CNM, [facility] 06/08/2017 11:25 AM

## 2017-06-11 ENCOUNTER — Emergency Department (HOSPITAL_COMMUNITY)
Admission: EM | Admit: 2017-06-11 | Discharge: 2017-06-11 | Discharge status: Against Medical Advice | Payer: Medicaid Other

## 2017-06-11 NOTE — ED Notes (Signed)
No answer in lobby when called for triage 

## 2017-06-11 NOTE — ED Notes (Signed)
No answer when called for triage 

## 2017-06-11 NOTE — ED Notes (Signed)
Not in waiting room.  Called back to go to room 23, no answer after several calls.

## 2017-07-06 ENCOUNTER — Ambulatory Visit (INDEPENDENT_AMBULATORY_CARE_PROVIDER_SITE_OTHER): Payer: Medicaid Other | Admitting: Women's Health

## 2017-07-06 ENCOUNTER — Encounter: Payer: Self-pay | Admitting: Women's Health

## 2017-07-06 VITALS — BP 90/50 | HR 94 | Wt 132.0 lb

## 2017-07-06 DIAGNOSIS — F1290 Cannabis use, unspecified, uncomplicated: Secondary | ICD-10-CM

## 2017-07-06 DIAGNOSIS — Z1389 Encounter for screening for other disorder: Secondary | ICD-10-CM

## 2017-07-06 DIAGNOSIS — Z3A24 24 weeks gestation of pregnancy: Secondary | ICD-10-CM

## 2017-07-06 DIAGNOSIS — Z331 Pregnant state, incidental: Secondary | ICD-10-CM

## 2017-07-06 DIAGNOSIS — Z3482 Encounter for supervision of other normal pregnancy, second trimester: Secondary | ICD-10-CM

## 2017-07-06 LAB — POCT URINALYSIS DIPSTICK
Blood, UA: NEGATIVE
Glucose, UA: NEGATIVE
Ketones, UA: NEGATIVE
Leukocytes, UA: NEGATIVE
NITRITE UA: NEGATIVE
Protein, UA: NEGATIVE

## 2017-07-06 NOTE — Progress Notes (Signed)
   LOW-RISK PREGNANCY VISIT Patient name: Julie [name] Loeza MRN [id number]  Date of birth: 04/05/1996 Chief Complaint:   Routine Prenatal Visit  History of Present Illness:   Julie [name] Grilliot is a 21 y.o. 922P1001 female at 7544w3d with an Estimated Date of Delivery: 10/23/17 being seen today for ongoing management of a low-risk pregnancy.  Today she reports stressed right now, lives w/ grandparents and they are getting evicted d/t missing rent, states she has a friend who says she can come stay w/ her.  .  .  Movement: Present. denies leaking of fluid. Review of Systems:   Pertinent items are noted in HPI Denies abnormal vaginal discharge w/ itching/odor/irritation, headaches, visual changes, shortness of breath, chest pain, abdominal pain, severe nausea/vomiting, or problems with urination or bowel movements unless otherwise stated above. Pertinent History Reviewed:  Reviewed past medical,surgical, social, obstetrical and family history.  Reviewed problem list, medications and allergies. Physical Assessment:   Vitals:   07/06/17 1024  BP: (!) 90/50  Pulse: 94  Weight: 132 lb (59.9 kg)  Body mass index is 21.97 kg/m.        Physical Examination:   General appearance: Well appearing, and in no distress  Mental status: Alert, oriented to person, place, and time  Skin: Warm & dry  Cardiovascular: Normal heart rate noted  Respiratory: Normal respiratory effort, no distress  Abdomen: Soft, gravid, nontender  Pelvic: Cervical exam deferred         Extremities: Edema: None  Fetal Status: Fetal Heart Rate (bpm): 158 Fundal Height: 24 cm Movement: Present    Results for orders placed or performed in visit on 07/06/17 (from the past 24 hour(s))  POCT urinalysis dipstick   Collection Time: 07/06/17 10:35 AM  Result Value Ref Range   Color, UA     Clarity, UA     Glucose, UA neg    Bilirubin, UA     Ketones, UA neg    Spec Grav, UA  1.010 - 1.025   Blood, UA neg    pH, UA  5.0 - 8.0   Protein, UA neg    Urobilinogen, UA  0.2 or 1.0 E.U./dL   Nitrite, UA neg    Leukocytes, UA Negative Negative    Assessment & Plan:  1) Low-risk pregnancy G2P1001 at 1444w3d with an Estimated Date of Delivery: 10/23/17    Labs/procedures today: none  Plan:  Continue routine obstetrical care   Reviewed: Preterm labor symptoms and general obstetric precautions including but not limited to vaginal bleeding, contractions, leaking of fluid and fetal movement were reviewed in detail with the patient.  All questions were answered  Follow-up: Return in about 4 weeks (around 08/03/2017) for LROB, PN2.  Orders Placed This Encounter  Procedures  . POCT urinalysis dipstick   Marge DuncansBooker, Diara Chaudhari Randall CNM, [facility] 07/06/2017 11:13 AM

## 2017-07-06 NOTE — Patient Instructions (Signed)
Julie [name], I greatly value your feedback.  If you receive a survey following your visit with us today, we appreciate you taking the time to fill it out.  Thanks, Julie [name], CNM, WHNP-BC   You will have your sugar test next visit.  Please do not eat or drink anything after midnight the night before you come, not even water.  You will be here for at least two hours.     Call the office [phone]) or go to [facility] if:  You begin to have strong, frequent contractions  Your water breaks.  Sometimes it is a big gush of fluid, sometimes it is just a trickle that keeps getting your panties wet or running down your legs  You have vaginal bleeding.  It is normal to have a small amount of spotting if your cervix was checked.   You don't feel your baby moving like normal.  If you don't, get you something to eat and drink and lay down and focus on feeling your baby move.   If your baby is still not moving like normal, you should call the office or [facility] to [facility].  Second Trimester of Pregnancy The second trimester is from week 13 through week 28, months 4 through 6. The second trimester is often a time when you feel your best. Your body has also adjusted to being pregnant, and you begin to feel better physically. Usually, morning sickness has lessened or quit completely, you may have more energy, and you may have an increase in appetite. The second trimester is also a time when the fetus is growing rapidly. At the end of the sixth month, the fetus is about 9 inches long and weighs about 1 pounds. You will likely begin to feel the baby move (quickening) between 18 and 20 weeks of the pregnancy. BODY CHANGES Your body goes through many changes during pregnancy. The changes vary from woman to woman.   Your weight will continue to increase. You will notice your lower abdomen bulging out.  You may begin to get stretch marks on your hips, abdomen, and breasts.  You may develop  headaches that can be relieved by medicines approved by your health care provider.  You may urinate more often because the fetus is pressing on your bladder.  You may develop or continue to have heartburn as a result of your pregnancy.  You may develop constipation because certain hormones are causing the muscles that push waste through your intestines to slow down.  You may develop hemorrhoids or swollen, bulging veins (varicose veins).  You may have back pain because of the weight gain and pregnancy hormones relaxing your joints between the bones in your pelvis and as a result of a shift in weight and the muscles that support your balance.  Your breasts will continue to grow and be tender.  Your gums may bleed and may be sensitive to brushing and flossing.  Dark spots or blotches (chloasma, mask of pregnancy) may develop on your face. This will likely fade after the baby is born.  A dark line from your belly button to the pubic area (linea nigra) may appear. This will likely fade after the baby is born.  You may have changes in your hair. These can include thickening of your hair, rapid growth, and changes in texture. Some women also have hair loss during or after pregnancy, or hair that feels dry or thin. Your hair will most likely return to normal after your  baby is born. WHAT TO EXPECT AT YOUR PRENATAL VISITS During a routine prenatal visit:  You will be weighed to make sure you and the fetus are growing normally.  Your blood pressure will be taken.  Your abdomen will be measured to track your baby's growth.  The fetal heartbeat will be listened to.  Any test results from the previous visit will be discussed. Your health care provider may ask you:  How you are feeling.  If you are feeling the baby move.  If you have had any abnormal symptoms, such as leaking fluid, bleeding, severe headaches, or abdominal cramping.  If you have any questions. Other tests that may be  performed during your second trimester include:  Blood tests that check for:  Low iron levels (anemia).  Gestational diabetes (between 24 and 28 weeks).  Rh antibodies.  Urine tests to check for infections, diabetes, or protein in the urine.  An ultrasound to confirm the proper growth and development of the baby.  An amniocentesis to check for possible genetic problems.  Fetal screens for spina bifida and Down syndrome. HOME CARE INSTRUCTIONS   Avoid all smoking, herbs, alcohol, and unprescribed drugs. These chemicals affect the formation and growth of the baby.  Follow your health care provider's instructions regarding medicine use. There are medicines that are either safe or unsafe to take during pregnancy.  Exercise only as directed by your health care provider. Experiencing uterine cramps is a good sign to stop exercising.  Continue to eat regular, healthy meals.  Wear a good support bra for breast tenderness.  Do not use hot tubs, steam rooms, or saunas.  Wear your seat belt at all times when driving.  Avoid raw meat, uncooked cheese, cat litter boxes, and soil used by cats. These carry germs that can cause birth defects in the baby.  Take your prenatal vitamins.  Try taking a stool softener (if your health care provider approves) if you develop constipation. Eat more high-fiber foods, such as fresh vegetables or fruit and whole grains. Drink plenty of fluids to keep your urine clear or pale yellow.  Take warm sitz baths to soothe any pain or discomfort caused by hemorrhoids. Use hemorrhoid cream if your health care provider approves.  If you develop varicose veins, wear support hose. Elevate your feet for 15 minutes, 3-4 times a day. Limit salt in your diet.  Avoid heavy lifting, wear low heel shoes, and practice good posture.  Rest with your legs elevated if you have leg cramps or low back pain.  Visit your dentist if you have not gone yet during your pregnancy.  Use a soft toothbrush to brush your teeth and be gentle when you floss.  A sexual relationship may be continued unless your health care provider directs you otherwise.  Continue to go to all your prenatal visits as directed by your health care provider. SEEK MEDICAL CARE IF:   You have dizziness.  You have mild pelvic cramps, pelvic pressure, or nagging pain in the abdominal area.  You have persistent nausea, vomiting, or diarrhea.  You have a bad smelling vaginal discharge.  You have pain with urination. SEEK IMMEDIATE MEDICAL CARE IF:   You have a fever.  You are leaking fluid from your vagina.  You have spotting or bleeding from your vagina.  You have severe abdominal cramping or pain.  You have rapid weight gain or loss.  You have shortness of breath with chest pain.  You notice sudden or extreme   swelling of your face, hands, ankles, feet, or legs.  You have not felt your baby move in over an hour.  You have severe headaches that do not go away with medicine.  You have vision changes. Document Released: 08/15/2001 Document Revised: 08/26/2013 Document Reviewed: 10/22/2012 [facility] Patient Information 2015 [place], [place]. This information is not intended to replace advice given to you by your health care provider. Make sure you discuss any questions you have with your health care provider.

## 2017-07-10 ENCOUNTER — Telehealth: Payer: Self-pay | Admitting: Obstetrics & Gynecology

## 2017-07-10 ENCOUNTER — Encounter: Payer: Self-pay | Admitting: *Deleted

## 2017-07-10 NOTE — Telephone Encounter (Signed)
Patient called stating that she needs a paperwork faxed to her Job stating that she has had her flu shot. Pt's Job fax number is [phone]. Please contact pt when the paperwork as been faxed over.

## 2017-07-10 NOTE — Telephone Encounter (Signed)
Informed patient information sent to number provided.

## 2017-08-03 ENCOUNTER — Other Ambulatory Visit: Payer: Medicaid Other

## 2017-08-03 ENCOUNTER — Encounter: Payer: Self-pay | Admitting: Women's Health

## 2017-08-03 ENCOUNTER — Ambulatory Visit (INDEPENDENT_AMBULATORY_CARE_PROVIDER_SITE_OTHER): Payer: Medicaid Other | Admitting: Women's Health

## 2017-08-03 VITALS — BP 100/60 | HR 83 | Wt 141.5 lb

## 2017-08-03 DIAGNOSIS — Z3482 Encounter for supervision of other normal pregnancy, second trimester: Secondary | ICD-10-CM

## 2017-08-03 DIAGNOSIS — Z131 Encounter for screening for diabetes mellitus: Secondary | ICD-10-CM

## 2017-08-03 DIAGNOSIS — Z3A28 28 weeks gestation of pregnancy: Secondary | ICD-10-CM

## 2017-08-03 DIAGNOSIS — Z331 Pregnant state, incidental: Secondary | ICD-10-CM

## 2017-08-03 DIAGNOSIS — Z3483 Encounter for supervision of other normal pregnancy, third trimester: Secondary | ICD-10-CM

## 2017-08-03 DIAGNOSIS — Z1389 Encounter for screening for other disorder: Secondary | ICD-10-CM

## 2017-08-03 LAB — POCT URINALYSIS DIPSTICK
GLUCOSE UA: NEGATIVE
Ketones, UA: NEGATIVE
NITRITE UA: NEGATIVE
Protein, UA: NEGATIVE

## 2017-08-03 NOTE — Patient Instructions (Signed)
Julie [name], I greatly value your feedback.  If you receive a survey following your visit with Julie today, we appreciate you taking the time to fill it out.  [name], Julie [name], CNM, WHNP-BC   Call the office [phone]) or go to [facility] if:  You begin to have strong, frequent contractions  Your water breaks.  Sometimes it is a big gush of fluid, sometimes it is just a trickle that keeps getting your panties wet or running down your legs  You have vaginal bleeding.  It is normal to have a small amount of spotting if your cervix was checked.   You don't feel your baby moving like normal.  If you don't, get you something to eat and drink and lay down and focus on feeling your baby move.  You should feel at least 10 movements in 2 hours.  If you don't, you should call the office or [facility] to [facility].    Tdap Vaccine  It is recommended that you get the Tdap vaccine during the third trimester of EACH pregnancy to help protect your baby from getting pertussis (whooping cough)  27-36 weeks is the BEST time to do this so that you can pass the protection on to your baby. During pregnancy is better than after pregnancy, but if you are unable to get it during pregnancy it will be offered at the hospital.   You can get this vaccine at the health department or your family doctor  Everyone who will be around your baby should also be up-to-date on their vaccines. Adults (who are not pregnant) only need 1 dose of Tdap during adulthood.   Third Trimester of Pregnancy The third trimester is from week 29 through week 42, months 7 through 9. The third trimester is a time when the fetus is growing rapidly. At the end of the ninth month, the fetus is about 20 inches in length and weighs 6-10 pounds.  BODY CHANGES Your body goes through many changes during pregnancy. The changes vary from woman to woman.   Your weight will continue to increase. You can expect to gain 25-35 pounds (11-16 kg) by  the end of the pregnancy.  You may begin to get stretch marks on your hips, abdomen, and breasts.  You may urinate more often because the fetus is moving lower into your pelvis and pressing on your bladder.  You may develop or continue to have heartburn as a result of your pregnancy.  You may develop constipation because certain hormones are causing the muscles that push waste through your intestines to slow down.  You may develop hemorrhoids or swollen, bulging veins (varicose veins).  You may have pelvic pain because of the weight gain and pregnancy hormones relaxing your joints between the bones in your pelvis. Backaches may result from overexertion of the muscles supporting your posture.  You may have changes in your hair. These can include thickening of your hair, rapid growth, and changes in texture. Some women also have hair loss during or after pregnancy, or hair that feels dry or thin. Your hair will most likely return to normal after your baby is born.  Your breasts will continue to grow and be tender. A yellow discharge may leak from your breasts called colostrum.  Your belly button may stick out.  You may feel short of breath because of your expanding uterus.  You may notice the fetus "dropping," or moving lower in your abdomen.  You may have a bloody  mucus discharge. This usually occurs a few days to a week before labor begins.  Your cervix becomes thin and soft (effaced) near your due date. WHAT TO EXPECT AT YOUR PRENATAL EXAMS  You will have prenatal exams every 2 weeks until week 36. Then, you will have weekly prenatal exams. During a routine prenatal visit:  You will be weighed to make sure you and the fetus are growing normally.  Your blood pressure is taken.  Your abdomen will be measured to track your baby's growth.  The fetal heartbeat will be listened to.  Any test results from the previous visit will be discussed.  You may have a cervical check near your  due date to see if you have effaced. At around 36 weeks, your caregiver will check your cervix. At the same time, your caregiver will also perform a test on the secretions of the vaginal tissue. This test is to determine if a type of bacteria, Group B streptococcus, is present. Your caregiver will explain this further. Your caregiver may ask you:  What your birth plan is.  How you are feeling.  If you are feeling the baby move.  If you have had any abnormal symptoms, such as leaking fluid, bleeding, severe headaches, or abdominal cramping.  If you have any questions. Other tests or screenings that may be performed during your third trimester include:  Blood tests that check for low iron levels (anemia).  Fetal testing to check the health, activity level, and growth of the fetus. Testing is done if you have certain medical conditions or if there are problems during the pregnancy. FALSE LABOR You may feel small, irregular contractions that eventually go away. These are called Braxton Hicks contractions, or false labor. Contractions may last for hours, days, or even weeks before true labor sets in. If contractions come at regular intervals, intensify, or become painful, it is best to be seen by your caregiver.  SIGNS OF LABOR   Menstrual-like cramps.  Contractions that are 5 minutes apart or less.  Contractions that start on the top of the uterus and spread down to the lower abdomen and back.  A sense of increased pelvic pressure or back pain.  A watery or bloody mucus discharge that comes from the vagina. If you have any of these signs before the 37th week of pregnancy, call your caregiver right away. You need to go to the hospital to get checked immediately. HOME CARE INSTRUCTIONS   Avoid all smoking, herbs, alcohol, and unprescribed drugs. These chemicals affect the formation and growth of the baby.  Follow your caregiver's instructions regarding medicine use. There are medicines  that are either safe or unsafe to take during pregnancy.  Exercise only as directed by your caregiver. Experiencing uterine cramps is a good sign to stop exercising.  Continue to eat regular, healthy meals.  Wear a good support bra for breast tenderness.  Do not use hot tubs, steam rooms, or saunas.  Wear your seat belt at all times when driving.  Avoid raw meat, uncooked cheese, cat litter boxes, and soil used by cats. These carry germs that can cause birth defects in the baby.  Take your prenatal vitamins.  Try taking a stool softener (if your caregiver approves) if you develop constipation. Eat more high-fiber foods, such as fresh vegetables or fruit and whole grains. Drink plenty of fluids to keep your urine clear or pale yellow.  Take warm sitz baths to soothe any pain or discomfort caused by hemorrhoids.  Use hemorrhoid cream if your caregiver approves.  If you develop varicose veins, wear support hose. Elevate your feet for 15 minutes, 3-4 times a day. Limit salt in your diet.  Avoid heavy lifting, wear low heal shoes, and practice good posture.  Rest a lot with your legs elevated if you have leg cramps or low back pain.  Visit your dentist if you have not gone during your pregnancy. Use a soft toothbrush to brush your teeth and be gentle when you floss.  A sexual relationship may be continued unless your caregiver directs you otherwise.  Do not travel far distances unless it is absolutely necessary and only with the approval of your caregiver.  Take prenatal classes to understand, practice, and ask questions about the labor and delivery.  Make a trial run to the hospital.  Pack your hospital bag.  Prepare the baby's nursery.  Continue to go to all your prenatal visits as directed by your caregiver. SEEK MEDICAL CARE IF:  You are unsure if you are in labor or if your water has broken.  You have dizziness.  You have mild pelvic cramps, pelvic pressure, or nagging  pain in your abdominal area.  You have persistent nausea, vomiting, or diarrhea.  You have a bad smelling vaginal discharge.  You have pain with urination. SEEK IMMEDIATE MEDICAL CARE IF:   You have a fever.  You are leaking fluid from your vagina.  You have spotting or bleeding from your vagina.  You have severe abdominal cramping or pain.  You have rapid weight loss or gain.  You have shortness of breath with chest pain.  You notice sudden or extreme swelling of your face, hands, ankles, feet, or legs.  You have not felt your baby move in over an hour.  You have severe headaches that do not go away with medicine.  You have vision changes. Document Released: 08/15/2001 Document Revised: 08/26/2013 Document Reviewed: 10/22/2012 [employer] Patient Information 2015 [employer]. This information is not intended to replace advice given to you by your health care provider. Make sure you discuss any questions you have with your health care provider.   

## 2017-08-03 NOTE — Progress Notes (Signed)
   LOW-RISK PREGNANCY VISIT Patient name: Julie [name] MRN [id number]  Date of birth: 07/14/1996 Chief Complaint:   Routine Prenatal Visit (PN2 today)  History of Present Illness:   Julie [name] is a 21 y.o. 742P1001 female at 5951w3d with an Estimated Date of Delivery: 10/23/17 being seen today for ongoing management of a low-risk pregnancy.  Today she reports no complaints.  . Vag. Bleeding: None.  Movement: Present. denies leaking of fluid. Review of Systems:   Pertinent items are noted in HPI Denies abnormal vaginal discharge w/ itching/odor/irritation, headaches, visual changes, shortness of breath, chest pain, abdominal pain, severe nausea/vomiting, or problems with urination or bowel movements unless otherwise stated above. Pertinent History Reviewed:  Reviewed past medical,surgical, social, obstetrical and family history.  Reviewed problem list, medications and allergies. Physical Assessment:   Vitals:   08/03/17 0939  BP: 100/60  Pulse: 83  Weight: 141 lb 8 oz (64.2 kg)  Body mass index is 23.55 kg/m.        Physical Examination:   General appearance: Well appearing, and in no distress  Mental status: Alert, oriented to person, place, and time  Skin: Warm & dry  Cardiovascular: Normal heart rate noted  Respiratory: Normal respiratory effort, no distress  Abdomen: Soft, gravid, nontender  Pelvic: Cervical exam deferred         Extremities: Edema: None  Fetal Status: Fetal Heart Rate (bpm): 156 Fundal Height: 28 cm Movement: Present    Results for orders placed or performed in visit on 08/03/17 (from the past 24 hour(s))  POCT urinalysis dipstick   Collection Time: 08/03/17  9:40 AM  Result Value Ref Range   Color, UA     Clarity, UA     Glucose, UA neg    Bilirubin, UA     Ketones, UA neg    Spec Grav, UA  1.010 - 1.025   Blood, UA trace    pH, UA  5.0 - 8.0   Protein, UA neg    Urobilinogen, UA  0.2 or 1.0 E.U./dL   Nitrite, UA neg    Leukocytes, UA  Trace (A) Negative    Assessment & Plan:  1) Low-risk pregnancy G2P1001 at 6351w3d with an Estimated Date of Delivery: 10/23/17    Labs/procedures today: pn2  Plan:  Continue routine obstetrical care   Reviewed: Preterm labor symptoms and general obstetric precautions including but not limited to vaginal bleeding, contractions, leaking of fluid and fetal movement were reviewed in detail with the patient.  All questions were answered  Follow-up: Return in about 4 weeks (around 08/31/2017) for LROB.  Orders Placed This Encounter  Procedures  . POCT urinalysis dipstick   Marge DuncansBooker, Khup Sapia Randall CNM, [facility] 08/03/2017 9:50 AM

## 2017-08-04 LAB — CBC
HEMATOCRIT: 32.8 % — AB (ref 34.0–46.6)
HEMOGLOBIN: 11 g/dL — AB (ref 11.1–15.9)
MCH: 30.3 pg (ref 26.6–33.0)
MCHC: 33.5 g/dL (ref 31.5–35.7)
MCV: 90 fL (ref 79–97)
Platelets: 283 10*3/uL (ref 150–379)
RBC: 3.63 x10E6/uL — ABNORMAL LOW (ref 3.77–5.28)
RDW: 13.7 % (ref 12.3–15.4)
WBC: 6.4 10*3/uL (ref 3.4–10.8)

## 2017-08-04 LAB — RPR: RPR: NONREACTIVE

## 2017-08-04 LAB — HIV ANTIBODY (ROUTINE TESTING W REFLEX): HIV SCREEN 4TH GENERATION: NONREACTIVE

## 2017-08-04 LAB — ANTIBODY SCREEN: Antibody Screen: NEGATIVE

## 2017-08-04 LAB — GLUCOSE TOLERANCE, 2 HOURS W/ 1HR
Glucose, 1 hour: 80 mg/dL (ref 65–179)
Glucose, 2 hour: 62 mg/dL — ABNORMAL LOW (ref 65–152)
Glucose, Fasting: 69 mg/dL (ref 65–91)

## 2017-08-31 ENCOUNTER — Ambulatory Visit (INDEPENDENT_AMBULATORY_CARE_PROVIDER_SITE_OTHER): Payer: Medicaid Other | Admitting: Obstetrics and Gynecology

## 2017-08-31 ENCOUNTER — Encounter: Payer: Self-pay | Admitting: Obstetrics and Gynecology

## 2017-08-31 VITALS — BP 110/60 | HR 99 | Wt 147.0 lb

## 2017-08-31 DIAGNOSIS — Z3483 Encounter for supervision of other normal pregnancy, third trimester: Secondary | ICD-10-CM | POA: Diagnosis not present

## 2017-08-31 DIAGNOSIS — Z331 Pregnant state, incidental: Secondary | ICD-10-CM

## 2017-08-31 DIAGNOSIS — Z3A32 32 weeks gestation of pregnancy: Secondary | ICD-10-CM | POA: Diagnosis not present

## 2017-08-31 DIAGNOSIS — Z1389 Encounter for screening for other disorder: Secondary | ICD-10-CM

## 2017-08-31 LAB — POCT URINALYSIS DIPSTICK
GLUCOSE UA: NEGATIVE
KETONES UA: NEGATIVE
LEUKOCYTES UA: NEGATIVE
Nitrite, UA: NEGATIVE
RBC UA: NEGATIVE

## 2017-08-31 NOTE — Progress Notes (Signed)
   LOW-RISK PREGNANCY VISIT Patient name: Julie [name] Aponte MRN [id number]  Date of birth: 02/12/1996 Chief Complaint:   Routine Prenatal Visit (NST today)  History of Present Illness:   Julie [name] Speakman is a 21 y.o. 332P1001 female at 548w3d with an Estimated Date of Delivery: 10/23/17 being seen today for ongoing management of a low-risk pregnancy.  Pt plans on using the Depo shot for birth control. Today she reports no complaints. Contractions: Not present.  .  Movement: Present. denies leaking of fluid. Review of Systems:   Pertinent items are noted in HPI Denies abnormal vaginal discharge w/ itching/odor/irritation, headaches, visual changes, shortness of breath, chest pain, abdominal pain, severe nausea/vomiting, or problems with urination or bowel movements unless otherwise stated above. Pertinent History Reviewed:  Reviewed past medical,surgical, social, obstetrical and family history.  Reviewed problem list, medications and allergies. Physical Assessment:   Vitals:   08/31/17 1011  BP: 110/60  Pulse: 99  Weight: 147 lb (66.7 kg)  Body mass index is 24.46 kg/m.        Physical Examination:   General appearance: Well appearing, and in no distress  Mental status: Alert, oriented to person, place, and time  Skin: Warm & dry  Cardiovascular: Normal heart rate noted  Respiratory: Normal respiratory effort, no distress  Abdomen: Soft, gravid, nontender  Pelvic: Cervical exam deferred         Extremities: Edema: None  Fetal Status: Fetal Heart Rate (bpm): 159 Fundal Height: 29 cm Movement: Present    Results for orders placed or performed in visit on 08/31/17 (from the past 24 hour(s))  POCT urinalysis dipstick   Collection Time: 08/31/17 10:23 AM  Result Value Ref Range   Color, UA     Clarity, UA     Glucose, UA neg    Bilirubin, UA     Ketones, UA neg    Spec Grav, UA  1.010 - 1.025   Blood, UA neg    pH, UA  5.0 - 8.0   Protein, UA trace    Urobilinogen, UA  0.2 or  1.0 E.U./dL   Nitrite, UA neg    Leukocytes, UA Negative Negative   Appearance     Odor      Assessment & Plan:  1) Low-risk pregnancy G2P1001 at 548w3d with an Estimated Date of Delivery: 10/23/17    Labs/procedures today: NST - reactive  Plan:  Continue routine obstetrical care   Reviewed: Preterm labor symptoms and general obstetric precautions including but not limited to vaginal bleeding, contractions, leaking of fluid and fetal movement were reviewed in detail with the patient.  All questions were answered  Follow-up: Return in about 2 weeks (around 09/14/2017) for LROB.  Orders Placed This Encounter  Procedures  . POCT urinalysis dipstick    By signing my name below, I, Izna Ahmed, attest that this documentation has been prepared under the direction and in the presence of Tilda BurrowFerguson, Zahlia Deshazer V, MD. Electronically Signed: Redge GainerIzna Ahmed, Medical Scribe. 08/31/17. 11:45 AM.  I personally performed the services described in this documentation, which was SCRIBED in my presence. The recorded information has been reviewed and considered accurate. It has been edited as necessary during review. Tilda BurrowJohn V Myonna Chisom, MD

## 2017-09-04 NOTE — L&D Delivery Note (Signed)
Delivery Note At 6:05 AM a viable female was delivered via Vaginal, Spontaneous (Presentation:LOP).  APGAR: 9, ; weight pending .   Placenta status:complete, intact .  Cord:3VC  with the following complications: none.  Cord pH: NA  Anesthesia:  Epidural  Episiotomy: None Lacerations: None Suture Repair: NA Est. Blood Loss (mL): 50  Mom to postpartum.  Baby to Couplet care / Skin to Skin.  Thressa ShellerHeather Kamiryn Bezanson 10/18/2017, 6:27 AM

## 2017-09-14 ENCOUNTER — Encounter: Payer: Self-pay | Admitting: Women's Health

## 2017-09-14 ENCOUNTER — Other Ambulatory Visit: Payer: Self-pay

## 2017-09-14 ENCOUNTER — Ambulatory Visit (INDEPENDENT_AMBULATORY_CARE_PROVIDER_SITE_OTHER): Payer: Medicaid Other | Admitting: Women's Health

## 2017-09-14 VITALS — BP 110/68 | HR 80 | Wt 148.0 lb

## 2017-09-14 DIAGNOSIS — Z1389 Encounter for screening for other disorder: Secondary | ICD-10-CM

## 2017-09-14 DIAGNOSIS — Z3A34 34 weeks gestation of pregnancy: Secondary | ICD-10-CM

## 2017-09-14 DIAGNOSIS — Z3483 Encounter for supervision of other normal pregnancy, third trimester: Secondary | ICD-10-CM

## 2017-09-14 DIAGNOSIS — Z331 Pregnant state, incidental: Secondary | ICD-10-CM

## 2017-09-14 LAB — POCT URINALYSIS DIPSTICK
Blood, UA: NEGATIVE
Glucose, UA: NEGATIVE
KETONES UA: NEGATIVE
Leukocytes, UA: NEGATIVE
Nitrite, UA: NEGATIVE
Protein, UA: NEGATIVE

## 2017-09-14 NOTE — Patient Instructions (Signed)
Julie [name], I greatly value your feedback.  If you receive a survey following your visit with us today, we appreciate you taking the time to fill it out.  Thanks, [name] Chavon Lucarelli, CNM, WHNP-BC   Call the office [phone]) or [facility] to [facility] if:  You begin to have strong, frequent contractions  Your water breaks.  Sometimes it is a big gush of fluid, sometimes it is just a trickle that keeps getting your panties wet or running down your legs  You have vaginal bleeding.  It is normal to have a small amount of spotting if your cervix was checked.   You don't feel your baby moving like normal.  If you don't, get you something to eat and drink and lay down and focus on feeling your baby move.  You should feel at least 10 movements in 2 hours.  If you don't, you should call the office or [facility] to [facility].     Preterm Labor and Birth Information The normal length of a pregnancy is 39-41 weeks. Preterm labor is when labor starts before 37 completed weeks of pregnancy. What are the risk factors for preterm labor? Preterm labor is more likely to occur in women who:  Have certain infections during pregnancy such as a bladder infection, sexually transmitted infection, or infection inside the uterus (chorioamnionitis).  Have a shorter-than-normal cervix.  Have gone into preterm labor before.  Have had surgery on their cervix.  Are younger than age 23 or older than age 23.  Are African American.  Are pregnant with twins or multiple babies (multiple gestation).  Take street drugs or smoke while pregnant.  Do not gain enough weight while pregnant.  Became pregnant shortly after having been pregnant.  What are the symptoms of preterm labor? Symptoms of preterm labor include:  Cramps similar to those that can happen during a menstrual period. The cramps may happen with diarrhea.  Pain in the abdomen or lower back.  Regular uterine contractions that may feel like tightening  of the abdomen.  A feeling of increased pressure in the pelvis.  Increased watery or bloody mucus discharge from the vagina.  Water breaking (ruptured amniotic sac).  Why is it important to recognize signs of preterm labor? It is important to recognize signs of preterm labor because babies who are born prematurely may not be fully developed. This can put them at an increased risk for:  Long-term (chronic) heart and lung problems.  Difficulty immediately after birth with regulating body systems, including blood sugar, body temperature, heart rate, and breathing rate.  Bleeding in the brain.  Cerebral palsy.  Learning difficulties.  Death.  These risks are highest for babies who are born before 34 weeks of pregnancy. How is preterm labor treated? Treatment depends on the length of your pregnancy, your condition, and the health of your baby. It may involve:  Having a stitch (suture) placed in your cervix to prevent your cervix from opening too early (cerclage).  Taking or being given medicines, such as: ? Hormone medicines. These may be given early in pregnancy to help support the pregnancy. ? Medicine to stop contractions. ? Medicines to help mature the baby's lungs. These may be prescribed if the risk of delivery is high. ? Medicines to prevent your baby from developing cerebral palsy.  If the labor happens before 34 weeks of pregnancy, you may need to stay in the hospital. What should I do if I think I am in preterm labor? If  you think that you are going into preterm labor, call your health care provider right away. How can I prevent preterm labor in future pregnancies? To increase your chance of having a full-term pregnancy:  Do not use any tobacco products, such as cigarettes, chewing tobacco, and e-cigarettes. If you need help quitting, ask your health care provider.  Do not use street drugs or medicines that have not been prescribed to you during your pregnancy.  Talk  with your health care provider before taking any herbal supplements, even if you have been taking them regularly.  Make sure you gain a healthy amount of weight during your pregnancy.  Watch for infection. If you think that you might have an infection, get it checked right away.  Make sure to tell your health care provider if you have gone into preterm labor before.  This information is not intended to replace advice given to you by your health care provider. Make sure you discuss any questions you have with your health care provider. Document Released: 11/11/2003 Document Revised: 02/01/2016 Document Reviewed: 01/12/2016 Elsevier Interactive Patient Education  2018 [employer].

## 2017-09-14 NOTE — Progress Notes (Signed)
   LOW-RISK PREGNANCY VISIT Patient name: Julie [name] MRN [id number]  Date of birth: 07/09/1996 Chief Complaint:   Routine Prenatal Visit  History of Present Illness:   Julie [name] is a 22 y.o. 732P1001 female at 5823w3d with an Estimated Date of Delivery: 10/23/17 being seen today for ongoing management of a low-risk pregnancy.  Today she reports no complaints. Contractions: Irregular. Vag. Bleeding: None.  Movement: Present. denies leaking of fluid. Review of Systems:   Pertinent items are noted in HPI Denies abnormal vaginal discharge w/ itching/odor/irritation, headaches, visual changes, shortness of breath, chest pain, abdominal pain, severe nausea/vomiting, or problems with urination or bowel movements unless otherwise stated above. Pertinent History Reviewed:  Reviewed past medical,surgical, social, obstetrical and family history.  Reviewed problem list, medications and allergies. Physical Assessment:   Vitals:   09/14/17 0927  BP: 110/68  Pulse: 80  Weight: 148 lb (67.1 kg)  Body mass index is 24.63 kg/m.        Physical Examination:   General appearance: Well appearing, and in no distress  Mental status: Alert, oriented to person, place, and time  Skin: Warm & dry  Cardiovascular: Normal heart rate noted  Respiratory: Normal respiratory effort, no distress  Abdomen: Soft, gravid, nontender  Pelvic: Cervical exam deferred         Extremities: Edema: None  Fetal Status: Fetal Heart Rate (bpm): 155 Fundal Height: 33 cm Movement: Present    Results for orders placed or performed in visit on 09/14/17 (from the past 24 hour(s))  POCT urinalysis dipstick   Collection Time: 09/14/17  9:30 AM  Result Value Ref Range   Color, UA     Clarity, UA     Glucose, UA neg    Bilirubin, UA     Ketones, UA neg    Spec Grav, UA  1.010 - 1.025   Blood, UA neg    pH, UA  5.0 - 8.0   Protein, UA neg    Urobilinogen, UA  0.2 or 1.0 E.U./dL   Nitrite, UA neg    Leukocytes, UA  Negative Negative   Appearance     Odor      Assessment & Plan:  1) Low-risk pregnancy G2P1001 at 5423w3d with an Estimated Date of Delivery: 10/23/17    Meds: No orders of the defined types were placed in this encounter.  Labs/procedures today: none  Plan:  Continue routine obstetrical care   Reviewed: Preterm labor symptoms and general obstetric precautions including but not limited to vaginal bleeding, contractions, leaking of fluid and fetal movement were reviewed in detail with the patient.  All questions were answered  Follow-up: Return in about 2 weeks (around 09/28/2017) for LROB.  Orders Placed This Encounter  Procedures  . POCT urinalysis dipstick   Marge DuncansBooker, Domenique Southers Randall CNM, [facility] 09/14/2017 9:55 AM

## 2017-09-28 ENCOUNTER — Ambulatory Visit (INDEPENDENT_AMBULATORY_CARE_PROVIDER_SITE_OTHER): Payer: Medicaid Other | Admitting: Obstetrics and Gynecology

## 2017-09-28 VITALS — BP 126/70 | HR 84 | Wt 150.2 lb

## 2017-09-28 DIAGNOSIS — Z3483 Encounter for supervision of other normal pregnancy, third trimester: Secondary | ICD-10-CM

## 2017-09-28 DIAGNOSIS — Z3A36 36 weeks gestation of pregnancy: Secondary | ICD-10-CM

## 2017-09-28 DIAGNOSIS — Z331 Pregnant state, incidental: Secondary | ICD-10-CM

## 2017-09-28 DIAGNOSIS — Z1389 Encounter for screening for other disorder: Secondary | ICD-10-CM

## 2017-09-28 LAB — POCT URINALYSIS DIPSTICK
Blood, UA: NEGATIVE
GLUCOSE UA: NEGATIVE
Ketones, UA: NEGATIVE
Leukocytes, UA: NEGATIVE
Nitrite, UA: NEGATIVE
Protein, UA: NEGATIVE

## 2017-09-28 NOTE — Progress Notes (Signed)
   LOW-RISK PREGNANCY VISIT Patient name: Julie [name] MRN [id number]  Date of birth: 12/26/1995 Chief Complaint:   Routine Prenatal Visit  History of Present Illness:   Julie [name] is a 22 y.o. 602P1001 female at 1068w3d with an Estimated Date of Delivery: 10/23/17 being seen today for ongoing management of a low-risk pregnancy.  Today she reports no complaints. Contractions: Irregular. Vag. Bleeding: None.  Movement: Present. denies leaking of fluid. Review of Systems:   Pertinent items are noted in HPI Denies abnormal vaginal discharge w/ itching/odor/irritation, headaches, visual changes, shortness of breath, chest pain, abdominal pain, severe nausea/vomiting, or problems with urination or bowel movements unless otherwise stated above. Pertinent History Reviewed:  Reviewed past medical,surgical, social, obstetrical and family history.  Reviewed problem list, medications and allergies. Physical Assessment:   Vitals:   09/28/17 1044  BP: 126/70  Pulse: 84  Weight: 150 lb 3.2 oz (68.1 kg)  Body mass index is 24.99 kg/m.        Physical Examination:   General appearance: Well appearing, and in no distress  Mental status: Alert, oriented to person, place, and time  Skin: Warm & dry  Cardiovascular: Normal heart rate noted  Respiratory: Normal respiratory effort, no distress  Abdomen: Soft, gravid, nontender  Pelvic: Cervical exam deferred         Extremities: Edema: None  Fetal Status: Fetal Heart Rate (bpm): 136 Fundal Height: 34 cm Movement: Present    Results for orders placed or performed in visit on 09/28/17 (from the past 24 hour(s))  POCT urinalysis dipstick   Collection Time: 09/28/17 10:45 AM  Result Value Ref Range   Color, UA     Clarity, UA     Glucose, UA neg    Bilirubin, UA     Ketones, UA neg    Spec Grav, UA  1.010 - 1.025   Blood, UA neg    pH, UA  5.0 - 8.0   Protein, UA neg    Urobilinogen, UA  0.2 or 1.0 E.U./dL   Nitrite, UA neg    Leukocytes, UA Negative Negative   Appearance     Odor      Assessment & Plan:  1) Low-risk pregnancy G2P1001 at 4268w3d with an Estimated Date of Delivery: 10/23/17   Meds: No orders of the defined types were placed in this encounter.  Labs/procedures today: Doppler  Plan:  Continue routine obstetrical care   Reviewed: Preterm labor symptoms and general obstetric precautions including but not limited to vaginal bleeding, contractions, leaking of fluid and fetal movement were reviewed in detail with the patient.  All questions were answered  Follow-up: Return in about 1 week (around 10/05/2017), or if symptoms worsen or fail to improve, for LROB.  Orders Placed This Encounter  Procedures  . POCT urinalysis dipstick     By signing my name below, I, Izna Ahmed, attest that this documentation has been prepared under the direction and in the presence of Tilda BurrowFerguson, Antonieta Slaven V, MD. Electronically Signed: Redge GainerIzna Ahmed, Medical Scribe. 09/28/17. 10:54 AM.  I personally performed the services described in this documentation, which was SCRIBED in my presence. The recorded information has been reviewed and considered accurate. It has been edited as necessary during review. Tilda BurrowJohn V Kobe Jansma, MD

## 2017-10-05 ENCOUNTER — Ambulatory Visit (INDEPENDENT_AMBULATORY_CARE_PROVIDER_SITE_OTHER): Payer: Medicaid Other | Admitting: Women's Health

## 2017-10-05 ENCOUNTER — Encounter: Payer: Self-pay | Admitting: Women's Health

## 2017-10-05 VITALS — BP 100/70 | HR 98 | Wt 153.0 lb

## 2017-10-05 DIAGNOSIS — Z3A37 37 weeks gestation of pregnancy: Secondary | ICD-10-CM

## 2017-10-05 DIAGNOSIS — Z3483 Encounter for supervision of other normal pregnancy, third trimester: Secondary | ICD-10-CM

## 2017-10-05 DIAGNOSIS — Z1389 Encounter for screening for other disorder: Secondary | ICD-10-CM

## 2017-10-05 DIAGNOSIS — Z331 Pregnant state, incidental: Secondary | ICD-10-CM

## 2017-10-05 LAB — POCT URINALYSIS DIPSTICK
GLUCOSE UA: NEGATIVE
Ketones, UA: NEGATIVE
LEUKOCYTES UA: NEGATIVE
Nitrite, UA: NEGATIVE
Protein, UA: NEGATIVE
RBC UA: NEGATIVE

## 2017-10-05 LAB — OB RESULTS CONSOLE GBS: GBS: NEGATIVE

## 2017-10-05 NOTE — Progress Notes (Signed)
   LOW-RISK PREGNANCY VISIT Patient name: Julie [name] Anker MRN [id number]  Date of birth: 10/29/1995 Chief Complaint:   low risk ob (GBS- CHL)  History of Present Illness:   Julie [name] Cosma is a 22 y.o. 592P1001 female at 1118w3d with an Estimated Date of Delivery: 10/23/17 being seen today for ongoing management of a low-risk pregnancy.  Today she reports no complaints. Contractions: Irregular.  .  Movement: Present. denies leaking of fluid. Review of Systems:   Pertinent items are noted in HPI Denies abnormal vaginal discharge w/ itching/odor/irritation, headaches, visual changes, shortness of breath, chest pain, abdominal pain, severe nausea/vomiting, or problems with urination or bowel movements unless otherwise stated above. Pertinent History Reviewed:  Reviewed past medical,surgical, social, obstetrical and family history.  Reviewed problem list, medications and allergies. Physical Assessment:   Vitals:   10/05/17 1004  BP: 100/70  Pulse: 98  Weight: 153 lb (69.4 kg)  Body mass index is 25.46 kg/m.        Physical Examination:   General appearance: Well appearing, and in no distress  Mental status: Alert, oriented to person, place, and time  Skin: Warm & dry  Cardiovascular: Normal heart rate noted  Respiratory: Normal respiratory effort, no distress  Abdomen: Soft, gravid, nontender  Pelvic: Cervical exam performed  Dilation: Fingertip Effacement (%): Thick Station: -2  Extremities: Edema: None  Fetal Status: Fetal Heart Rate (bpm): 158 Fundal Height: 35 cm Movement: Present Presentation: Vertex  Results for orders placed or performed in visit on 10/05/17 (from the past 24 hour(s))  POCT urinalysis dipstick   Collection Time: 10/05/17 10:08 AM  Result Value Ref Range   Color, UA     Clarity, UA     Glucose, UA neg    Bilirubin, UA     Ketones, UA neg    Spec Grav, UA  1.010 - 1.025   Blood, UA neg    pH, UA  5.0 - 8.0   Protein, UA neg    Urobilinogen, UA  0.2 or  1.0 E.U./dL   Nitrite, UA neg    Leukocytes, UA Negative Negative   Appearance     Odor      Assessment & Plan:  1) Low-risk pregnancy G2P1001 at 6918w3d with an Estimated Date of Delivery: 10/23/17    Meds: No orders of the defined types were placed in this encounter.  Labs/procedures today: sve, gbs, gc/ct  Plan:  Continue routine obstetrical care   Reviewed: Term labor symptoms and general obstetric precautions including but not limited to vaginal bleeding, contractions, leaking of fluid and fetal movement were reviewed in detail with the patient.  All questions were answered  Follow-up: Return in about 1 week (around 10/12/2017) for LROB.  Orders Placed This Encounter  Procedures  . Strep Gp B NAA  . GC/Chlamydia Probe Amp  . POCT urinalysis dipstick   Marge DuncansBooker, Jayceion Lisenby Randall CNM, [facility]BC 10/05/2017 10:21 AM

## 2017-10-05 NOTE — Patient Instructions (Signed)
Julie [name], I greatly value your feedback.  If you receive a survey following your visit with us today, we appreciate you taking the time to fill it out.  Thanks, Julie [name], CNM, WHNP-BC   Call the office [phone]) or [facility] to [facility] if:  You begin to have strong, frequent contractions  Your water breaks.  Sometimes it is a big gush of fluid, sometimes it is just a trickle that keeps getting your panties wet or running down your legs  You have vaginal bleeding.  It is normal to have a small amount of spotting if your cervix was checked.   You don't feel your baby moving like normal.  If you don't, get you something to eat and drink and lay down and focus on feeling your baby move.  You should feel at least 10 movements in 2 hours.  If you don't, you should call the office or go to [facility].     [facility] Contractions of the uterus can occur throughout pregnancy, but they are not always a sign that you are in labor. You may have practice contractions called Braxton Hicks contractions. These false labor contractions are sometimes confused with true labor. What are Deberah PeltonBraxton Hicks contractions? Braxton Hicks contractions are tightening movements that occur in the muscles of the uterus before labor. Unlike true labor contractions, these contractions do not result in opening (dilation) and thinning of the cervix. Toward the end of pregnancy (32-34 weeks), Braxton Hicks contractions can happen more often and may become stronger. These contractions are sometimes difficult to tell apart from true labor because they can be very uncomfortable. You should not feel embarrassed if you go to the hospital with false labor. Sometimes, the only way to tell if you are in true labor is for your health care provider to look for changes in the cervix. The health care provider will do a physical exam and may monitor your contractions. If you are not in true labor, the exam should  show that your cervix is not dilating and your water has not broken. If there are other health problems associated with your pregnancy, it is completely safe for you to be sent home with false labor. You may continue to have Braxton Hicks contractions until you go into true labor. How to tell the difference between true labor and false labor True labor  Contractions last 30-70 seconds.  Contractions become very regular.  Discomfort is usually felt in the top of the uterus, and it spreads to the lower abdomen and low back.  Contractions do not go away with walking.  Contractions usually become more intense and increase in frequency.  The cervix dilates and gets thinner. False labor  Contractions are usually shorter and not as strong as true labor contractions.  Contractions are usually irregular.  Contractions are often felt in the front of the lower abdomen and in the groin.  Contractions may go away when you walk around or change positions while lying down.  Contractions get weaker and are shorter-lasting as time goes on.  The cervix usually does not dilate or become thin. Follow these instructions at home:  Take over-the-counter and prescription medicines only as told by your health care provider.  Keep up with your usual exercises and follow other instructions from your health care provider.  Eat and drink lightly if you think you are going into labor.  If Braxton Hicks contractions are making you uncomfortable: ? Change your position from lying  down or resting to walking, or change from walking to resting. ? Sit and rest in a tub of warm water. ? Drink enough fluid to keep your urine pale yellow. Dehydration may cause these contractions. ? Do slow and deep breathing several times an hour.  Keep all follow-up prenatal visits as told by your health care provider. This is important. Contact a health care provider if:  You have a fever.  You have continuous pain in  your abdomen. Get help right away if:  Your contractions become stronger, more regular, and closer together.  You have fluid leaking or gushing from your vagina.  You pass blood-tinged mucus (bloody show).  You have bleeding from your vagina.  You have low back pain that you never had before.  You feel your baby's head pushing down and causing pelvic pressure.  Your baby is not moving inside you as much as it used to. Summary  Contractions that occur before labor are called Braxton Hicks contractions, false labor, or practice contractions.  Braxton Hicks contractions are usually shorter, weaker, farther apart, and less regular than true labor contractions. True labor contractions usually become progressively stronger and regular and they become more frequent.  Manage discomfort from [facility] contractions by changing position, resting in a warm bath, drinking plenty of water, or practicing deep breathing. This information is not intended to replace advice given to you by your health care provider. Make sure you discuss any questions you have with your health care provider. Document Released: 01/04/2017 Document Revised: 01/04/2017 Document Reviewed: 01/04/2017 Elsevier Interactive Patient Education  2018 [employer].

## 2017-10-07 LAB — STREP GP B NAA: Strep Gp B NAA: NEGATIVE

## 2017-10-09 LAB — GC/CHLAMYDIA PROBE AMP
CHLAMYDIA, DNA PROBE: NEGATIVE
NEISSERIA GONORRHOEAE BY PCR: NEGATIVE

## 2017-10-12 ENCOUNTER — Ambulatory Visit (INDEPENDENT_AMBULATORY_CARE_PROVIDER_SITE_OTHER): Payer: Medicaid Other | Admitting: Obstetrics and Gynecology

## 2017-10-12 ENCOUNTER — Encounter: Payer: Self-pay | Admitting: Obstetrics and Gynecology

## 2017-10-12 VITALS — BP 108/60 | HR 92 | Wt 155.0 lb

## 2017-10-12 DIAGNOSIS — Z1389 Encounter for screening for other disorder: Secondary | ICD-10-CM

## 2017-10-12 DIAGNOSIS — Z3483 Encounter for supervision of other normal pregnancy, third trimester: Secondary | ICD-10-CM

## 2017-10-12 DIAGNOSIS — Z3A38 38 weeks gestation of pregnancy: Secondary | ICD-10-CM

## 2017-10-12 DIAGNOSIS — Z331 Pregnant state, incidental: Secondary | ICD-10-CM

## 2017-10-12 LAB — POCT URINALYSIS DIPSTICK
Blood, UA: NEGATIVE
GLUCOSE UA: NEGATIVE
KETONES UA: NEGATIVE
Leukocytes, UA: NEGATIVE
Nitrite, UA: NEGATIVE
PROTEIN UA: NEGATIVE

## 2017-10-12 NOTE — Progress Notes (Signed)
[id number]  Estimated Date of Delivery: 10/23/17 LROB 5371w3d  Chief Complaint  Patient presents with  . Routine Prenatal Visit  ____  Patient complaints:none. Patient reports   good fetal movement,                           denies any bleeding , rupture of membranes,or regular contractions.  Blood pressure 108/60, pulse 92, weight 155 lb (70.3 kg), last menstrual period 01/26/2017.   Urine results:notable for neg protein refer to the ob flow sheet for FH and FHR, ,                          Physical Examination: General appearance - alert, well appearing, and in no distress, oriented to person, place, and time and normal appearing weight                                      Abdomen - FH 34 ,                                                         -FHR 132                                                         soft, nontender, nondistended, no masses or organomegaly                                      Pelvic - VULVA: normal appearing vulva with no masses, tenderness or lesions, VAGINA: normal appearing vagina with normal color and discharge, no lesions Cervix lont thick closed                                            Questions were answered. Assessment: LROB G2P1001 @ 7971w3d lrob  Plan:  Continued routine obstetrical care,   F/u in 1lweeks for lrob

## 2017-10-12 NOTE — Patient Instructions (Signed)
Third Trimester of Pregnancy The third trimester is from week 28 through week 40 (months 7 through 9). The third trimester is a time when the unborn baby (fetus) is growing rapidly. At the end of the ninth month, the fetus is about 20 inches in length and weighs 6-10 pounds. Body changes during your third trimester Your body will continue to go through many changes during pregnancy. The changes vary from woman to woman. During the third trimester:  Your weight will continue to increase. You can expect to gain 25-35 pounds (11-16 kg) by the end of the pregnancy.  You may begin to get stretch marks on your hips, abdomen, and breasts.  You may urinate more often because the fetus is moving lower into your pelvis and pressing on your bladder.  You may develop or continue to have heartburn. This is caused by increased hormones that slow down muscles in the digestive tract.  You may develop or continue to have constipation because increased hormones slow digestion and cause the muscles that push waste through your intestines to relax.  You may develop hemorrhoids. These are swollen veins (varicose veins) in the rectum that can itch or be painful.  You may develop swollen, bulging veins (varicose veins) in your legs.  You may have increased body aches in the pelvis, back, or thighs. This is due to weight gain and increased hormones that are relaxing your joints.  You may have changes in your hair. These can include thickening of your hair, rapid growth, and changes in texture. Some women also have hair loss during or after pregnancy, or hair that feels dry or thin. Your hair will most likely return to normal after your baby is born.  Your breasts will continue to grow and they will continue to become tender. A yellow fluid (colostrum) may leak from your breasts. This is the first milk you are producing for your baby.  Your belly button may stick out.  You may notice more swelling in your hands,  face, or ankles.  You may have increased tingling or numbness in your hands, arms, and legs. The skin on your belly may also feel numb.  You may feel short of breath because of your expanding uterus.  You may have more problems sleeping. This can be caused by the size of your belly, increased need to urinate, and an increase in your body's metabolism.  You may notice the fetus "dropping," or moving lower in your abdomen (lightening).  You may have increased vaginal discharge.  You may notice your joints feel loose and you may have pain around your pelvic bone.  What to expect at prenatal visits You will have prenatal exams every 2 weeks until week 36. Then you will have weekly prenatal exams. During a routine prenatal visit:  You will be weighed to make sure you and the baby are growing normally.  Your blood pressure will be taken.  Your abdomen will be measured to track your baby's growth.  The fetal heartbeat will be listened to.  Any test results from the previous visit will be discussed.  You may have a cervical check near your due date to see if your cervix has softened or thinned (effaced).  You will be tested for Group B streptococcus. This happens between 35 and 37 weeks.  Your health care provider may ask you:  What your birth plan is.  How you are feeling.  If you are feeling the baby move.  If you have had   any abnormal symptoms, such as leaking fluid, bleeding, severe headaches, or abdominal cramping.  If you are using any tobacco products, including cigarettes, chewing tobacco, and electronic cigarettes.  If you have any questions.  Other tests or screenings that may be performed during your third trimester include:  Blood tests that check for low iron levels (anemia).  Fetal testing to check the health, activity level, and growth of the fetus. Testing is done if you have certain medical conditions or if there are problems during the  pregnancy.  Nonstress test (NST). This test checks the health of your baby to make sure there are no signs of problems, such as the baby not getting enough oxygen. During this test, a belt is placed around your belly. The baby is made to move, and its heart rate is monitored during movement.  What is false labor? False labor is a condition in which you feel small, irregular tightenings of the muscles in the womb (contractions) that usually go away with rest, changing position, or drinking water. These are called Braxton Hicks contractions. Contractions may last for hours, days, or even weeks before true labor sets in. If contractions come at regular intervals, become more frequent, increase in intensity, or become painful, you should see your health care provider. What are the signs of labor?  Abdominal cramps.  Regular contractions that start at 10 minutes apart and become stronger and more frequent with time.  Contractions that start on the top of the uterus and spread down to the lower abdomen and back.  Increased pelvic pressure and dull back pain.  A watery or bloody mucus discharge that comes from the vagina.  Leaking of amniotic fluid. This is also known as your "water breaking." It could be a slow trickle or a gush. Let your health care provider know if it has a color or strange odor. If you have any of these signs, call your health care provider right away, even if it is before your due date. Follow these instructions at home: Medicines  Follow your health care provider's instructions regarding medicine use. Specific medicines may be either safe or unsafe to take during pregnancy.  Take a prenatal vitamin that contains at least 600 micrograms (mcg) of folic acid.  If you develop constipation, try taking a stool softener if your health care provider approves. Eating and drinking  Eat a balanced diet that includes fresh fruits and vegetables, whole grains, good sources of protein  such as meat, eggs, or tofu, and low-fat dairy. Your health care provider will help you determine the amount of weight gain that is right for you.  Avoid raw meat and uncooked cheese. These carry germs that can cause birth defects in the baby.  If you have low calcium intake from food, talk to your health care provider about whether you should take a daily calcium supplement.  Eat four or five small meals rather than three large meals a day.  Limit foods that are high in fat and processed sugars, such as fried and sweet foods.  To prevent constipation: ? Drink enough fluid to keep your urine clear or pale yellow. ? Eat foods that are high in fiber, such as fresh fruits and vegetables, whole grains, and beans. Activity  Exercise only as directed by your health care provider. Most women can continue their usual exercise routine during pregnancy. Try to exercise for 30 minutes at least 5 days a week. Stop exercising if you experience uterine contractions.  Avoid heavy   lifting.  Do not exercise in extreme heat or humidity, or at high altitudes.  Wear low-heel, comfortable shoes.  Practice good posture.  You may continue to have sex unless your health care provider tells you otherwise. Relieving pain and discomfort  Take frequent breaks and rest with your legs elevated if you have leg cramps or low back pain.  Take warm sitz baths to soothe any pain or discomfort caused by hemorrhoids. Use hemorrhoid cream if your health care provider approves.  Wear a good support bra to prevent discomfort from breast tenderness.  If you develop varicose veins: ? Wear support pantyhose or compression stockings as told by your healthcare provider. ? Elevate your feet for 15 minutes, 3-4 times a day. Prenatal care  Write down your questions. Take them to your prenatal visits.  Keep all your prenatal visits as told by your health care provider. This is important. Safety  Wear your seat belt at  all times when driving.  Make a list of emergency phone numbers, including numbers for family, friends, the hospital, and police and fire departments. General instructions  Avoid cat litter boxes and soil used by cats. These carry germs that can cause birth defects in the baby. If you have a cat, ask someone to clean the litter box for you.  Do not travel far distances unless it is absolutely necessary and only with the approval of your health care provider.  Do not use hot tubs, steam rooms, or saunas.  Do not drink alcohol.  Do not use any products that contain nicotine or tobacco, such as cigarettes and e-cigarettes. If you need help quitting, ask your health care provider.  Do not use any medicinal herbs or unprescribed drugs. These chemicals affect the formation and growth of the baby.  Do not douche or use tampons or scented sanitary pads.  Do not cross your legs for long periods of time.  To prepare for the arrival of your baby: ? Take prenatal classes to understand, practice, and ask questions about labor and delivery. ? Make a trial run to the hospital. ? Visit the hospital and tour the maternity area. ? Arrange for maternity or paternity leave through employers. ? Arrange for family and friends to take care of pets while you are in the hospital. ? Purchase a rear-facing car seat and make sure you know how to install it in your car. ? Pack your hospital bag. ? Prepare the baby's nursery. Make sure to remove all pillows and stuffed animals from the baby's crib to prevent suffocation.  Visit your dentist if you have not gone during your pregnancy. Use a soft toothbrush to brush your teeth and be gentle when you floss. Contact a health care provider if:  You are unsure if you are in labor or if your water has broken.  You become dizzy.  You have mild pelvic cramps, pelvic pressure, or nagging pain in your abdominal area.  You have lower back pain.  You have persistent  nausea, vomiting, or diarrhea.  You have an unusual or bad smelling vaginal discharge.  You have pain when you urinate. Get help right away if:  Your water breaks before 37 weeks.  You have regular contractions less than 5 minutes apart before 37 weeks.  You have a fever.  You are leaking fluid from your vagina.  You have spotting or bleeding from your vagina.  You have severe abdominal pain or cramping.  You have rapid weight loss or weight gain.    You have shortness of breath with chest pain.  You notice sudden or extreme swelling of your face, hands, ankles, feet, or legs.  Your baby makes fewer than 10 movements in 2 hours.  You have severe headaches that do not go away when you take medicine.  You have vision changes. Summary  The third trimester is from week 28 through week 40, months 7 through 9. The third trimester is a time when the unborn baby (fetus) is growing rapidly.  During the third trimester, your discomfort may increase as you and your baby continue to gain weight. You may have abdominal, leg, and back pain, sleeping problems, and an increased need to urinate.  During the third trimester your breasts will keep growing and they will continue to become tender. A yellow fluid (colostrum) may leak from your breasts. This is the first milk you are producing for your baby.  False labor is a condition in which you feel small, irregular tightenings of the muscles in the womb (contractions) that eventually go away. These are called Braxton Hicks contractions. Contractions may last for hours, days, or even weeks before true labor sets in.  Signs of labor can include: abdominal cramps; regular contractions that start at 10 minutes apart and become stronger and more frequent with time; watery or bloody mucus discharge that comes from the vagina; increased pelvic pressure and dull back pain; and leaking of amniotic fluid. This information is not intended to replace advice  given to you by your health care provider. Make sure you discuss any questions you have with your health care provider. Document Released: 08/15/2001 Document Revised: 01/27/2016 Document Reviewed: 10/22/2012 Elsevier Interactive Patient Education  2017 [employer]  

## 2017-10-17 ENCOUNTER — Other Ambulatory Visit: Payer: Self-pay

## 2017-10-17 ENCOUNTER — Inpatient Hospital Stay (HOSPITAL_COMMUNITY)
Admission: AD | Admit: 2017-10-17 | Discharge: 2017-10-20 | DRG: 807 | Disposition: A | Discharge status: Home / Self Care | Payer: Medicaid Other | Source: Ambulatory Visit | Attending: Obstetrics & Gynecology | Admitting: Obstetrics & Gynecology

## 2017-10-17 ENCOUNTER — Encounter: Payer: Self-pay | Admitting: Advanced Practice Midwife

## 2017-10-17 ENCOUNTER — Telehealth: Payer: Self-pay | Admitting: *Deleted

## 2017-10-17 ENCOUNTER — Ambulatory Visit (INDEPENDENT_AMBULATORY_CARE_PROVIDER_SITE_OTHER): Payer: Medicaid Other | Admitting: Advanced Practice Midwife

## 2017-10-17 ENCOUNTER — Encounter (HOSPITAL_COMMUNITY): Payer: Self-pay

## 2017-10-17 VITALS — BP 120/80 | HR 97 | Wt 155.0 lb

## 2017-10-17 DIAGNOSIS — Z3A39 39 weeks gestation of pregnancy: Secondary | ICD-10-CM

## 2017-10-17 DIAGNOSIS — Z3483 Encounter for supervision of other normal pregnancy, third trimester: Secondary | ICD-10-CM

## 2017-10-17 DIAGNOSIS — Z331 Pregnant state, incidental: Secondary | ICD-10-CM

## 2017-10-17 DIAGNOSIS — Z1389 Encounter for screening for other disorder: Secondary | ICD-10-CM

## 2017-10-17 LAB — POCT URINALYSIS DIPSTICK
Blood, UA: NEGATIVE
Glucose, UA: NEGATIVE
KETONES UA: NEGATIVE
Leukocytes, UA: NEGATIVE
NITRITE UA: NEGATIVE
Protein, UA: NEGATIVE

## 2017-10-17 LAB — CBC
HEMATOCRIT: 35.1 % — AB (ref 36.0–46.0)
HEMOGLOBIN: 12.2 g/dL (ref 12.0–15.0)
MCH: 31 pg (ref 26.0–34.0)
MCHC: 34.8 g/dL (ref 30.0–36.0)
MCV: 89.3 fL (ref 78.0–100.0)
Platelets: 293 10*3/uL (ref 150–400)
RBC: 3.93 MIL/uL (ref 3.87–5.11)
RDW: 13.5 % (ref 11.5–15.5)
WBC: 10.3 10*3/uL (ref 4.0–10.5)

## 2017-10-17 MED ORDER — PHENYLEPHRINE 40 MCG/ML (10ML) SYRINGE FOR IV PUSH (FOR BLOOD PRESSURE SUPPORT): 80.0000 ug | PREFILLED_SYRINGE | INTRAVENOUS | Status: DC | PRN

## 2017-10-17 MED ORDER — ACETAMINOPHEN 325 MG PO TABS: 650.0000 mg | ORAL_TABLET | ORAL | Status: DC | PRN

## 2017-10-17 MED ORDER — ONDANSETRON HCL 4 MG/2ML IJ SOLN: 4.0000 mg | Freq: Four times a day (QID) | INTRAMUSCULAR | Status: DC | PRN

## 2017-10-17 MED ORDER — FENTANYL CITRATE (PF) 100 MCG/2ML IJ SOLN: 50.0000 ug | INTRAMUSCULAR | Status: DC | PRN

## 2017-10-17 MED ORDER — OXYCODONE-ACETAMINOPHEN 5-325 MG PO TABS: 2.0000 | ORAL_TABLET | ORAL | Status: DC | PRN

## 2017-10-17 MED ORDER — PHENYLEPHRINE 40 MCG/ML (10ML) SYRINGE FOR IV PUSH (FOR BLOOD PRESSURE SUPPORT)
80.0000 ug | PREFILLED_SYRINGE | INTRAVENOUS | Status: DC | PRN
  Filled 2017-10-17: qty 10

## 2017-10-17 MED ORDER — LACTATED RINGERS IV SOLN: 500.0000 mL | Freq: Once | INTRAVENOUS | Status: DC

## 2017-10-17 MED ORDER — EPHEDRINE 5 MG/ML INJ: 10.0000 mg | INTRAVENOUS | Status: DC | PRN

## 2017-10-17 MED ORDER — LACTATED RINGERS IV SOLN: 500.0000 mL | INTRAVENOUS | Status: DC | PRN

## 2017-10-17 MED ORDER — LACTATED RINGERS IV SOLN
INTRAVENOUS | Status: DC
  Administered 2017-10-18: 02:00:00 via INTRAVENOUS

## 2017-10-17 MED ORDER — OXYTOCIN 40 UNITS IN LACTATED RINGERS INFUSION - SIMPLE MED
2.5000 [IU]/h | INTRAVENOUS | Status: DC
  Filled 2017-10-17: qty 1000

## 2017-10-17 MED ORDER — DIPHENHYDRAMINE HCL 50 MG/ML IJ SOLN: 12.5000 mg | INTRAMUSCULAR | Status: DC | PRN

## 2017-10-17 MED ORDER — SOD CITRATE-CITRIC ACID 500-334 MG/5ML PO SOLN: 30.0000 mL | ORAL | Status: DC | PRN

## 2017-10-17 MED ORDER — OXYCODONE-ACETAMINOPHEN 5-325 MG PO TABS: 1.0000 | ORAL_TABLET | ORAL | Status: DC | PRN

## 2017-10-17 MED ORDER — LIDOCAINE HCL (PF) 1 % IJ SOLN
30.0000 mL | INTRAMUSCULAR | Status: DC | PRN
  Filled 2017-10-17: qty 30

## 2017-10-17 MED ORDER — FENTANYL 2.5 MCG/ML BUPIVACAINE 1/10 % EPIDURAL INFUSION (WH - ANES)
14.0000 mL/h | INTRAMUSCULAR | Status: DC | PRN
  Administered 2017-10-18: 14 mL/h via EPIDURAL
  Filled 2017-10-17: qty 100

## 2017-10-17 MED ORDER — OXYTOCIN BOLUS FROM INFUSION
500.0000 mL | Freq: Once | INTRAVENOUS | Status: AC
  Administered 2017-10-18: 500 mL via INTRAVENOUS

## 2017-10-17 NOTE — Progress Notes (Signed)
WORK IN FOR LABOR CHECK   Pt call to RN this am:  Patient states she has been contracting since 3am and when she took her daughter to school, she actually had to pull over due to the contractions. States she is contracting about every 5 minutes with some being stronger than others. No leaking or bleeding noted. Advised patient she could come to our office for labor check. Will come at 9:45.   G2P1001 2721w1d Estimated Date of Delivery: 10/23/17  Blood pressure 120/80, pulse 97, weight 155 lb (70.3 kg), last menstrual period 01/26/2017.   BP weight and urine results all reviewed and noted.  Please refer to the obstetrical flow sheet for the fundal height and fetal heart rate documentation:   Says ctx are q 5 minutes.  Doesn't look like she's in labor. Cx FT/50/-2/posterior  Orders Placed This Encounter  Procedures  . POCT urinalysis dipstick    Plan:  Continued routine obstetrical care,   Return for As scheduled.

## 2017-10-17 NOTE — Telephone Encounter (Signed)
Patient states she has been contracting since 3am and when she took her daughter to school, she actually had to pull over due to the contractions. States she is contracting about every 5 minutes with some being stronger than others. No leaking or bleeding noted. Advised patient she could come to our office for labor check. Will come at 9:45.

## 2017-10-17 NOTE — H&P (Signed)
Julie [name] is a 22 y.o. female presenting for labor. OB History    Gravida Para Term Preterm AB Living   2 1 1     1    SAB TAB Ectopic Multiple Live Births           1     Past Medical History:  Diagnosis Date  . Medical history non-contributory    Past Surgical History:  Procedure Laterality Date  . NO PAST SURGERIES     Family History: family history includes Diabetes in her paternal grandmother. Social History:  reports that  has never smoked. she has never used smokeless tobacco. She reports that she does not drink alcohol or use drugs.  Clinic [facility]  Initiated Care at  11wk   FOB Michiel CowboyBrandon Roach  Dating By 1st trimester U/S 11wk  Pap 2018 negative RCHD  GC/CT Initial: -/-               36+wks:  -/-  Genetic Screen NT/IT: neg  CF screen negative  Anatomic US Normal female  Flu vaccine 05/18/17   Tdap Recommended ~ 28wks  Glucose Screen  2 hr normal: 69/80/62  GBS neg  Feed Preference breast  Contraception depo  Circumcision Yes, at FT  Childbirth Classes declined  Pediatrician [facility]      Maternal Diabetes: No Genetic Screening: Normal Maternal Ultrasounds/Referrals: Normal Fetal Ultrasounds or other Referrals:  None Maternal Substance Abuse:  No Significant Maternal Medications:  None Significant Maternal Lab Results:  None Other Comments:  None  ROS Maternal Medical History:  Reason for admission: Contractions.   Contractions: Onset was 3-5 hours ago.   Frequency: regular.   Perceived severity is strong.    Fetal activity: Perceived fetal activity is normal.   Last perceived fetal movement was within the past hour.    Prenatal Complications - Diabetes: none.      Blood pressure (!) 155/82, pulse 76, temperature 97.7 F (36.5 C), temperature source Oral, resp. rate 16, last menstrual period 01/26/2017, SpO2 100 %. Maternal Exam:  Uterine Assessment: Contraction strength is firm.  Contraction frequency is regular.   Abdomen:  Patient reports no abdominal tenderness. Introitus: Ferning test: not done.  Nitrazine test: not done.  Pelvis: adequate for delivery.   Cervix: Cervix evaluated by digital exam.     Fetal Exam Fetal Monitor Review: Mode: ultrasound.   Baseline rate: 130.  Variability: moderate (6-25 bpm).   Pattern: accelerations present and no decelerations.    Fetal State Assessment: Category I - tracings are normal.     Physical Exam  Nursing note and vitals reviewed. Constitutional: She is oriented to person, place, and time. She appears well-developed and well-nourished. No distress.  HENT:  Head: Normocephalic.  Cardiovascular: Normal rate.  Respiratory: Effort normal.  GI: Soft. There is no tenderness. There is no rebound.  Neurological: She is alert and oriented to person, place, and time.  Skin: Skin is warm and dry.  Psychiatric: She has a normal mood and affect.    Prenatal labs: ABO, Rh: O/Positive/-- (08/03 1256) Antibody: Negative (11/30 0912) Rubella: 1.46 (08/03 1256) RPR: Non Reactive (11/30 0912)  HBsAg: Negative (08/03 1256)  HIV: Non Reactive (11/30 0912)  GBS: Negative (02/01 1145)   Assessment/Plan: 22 y.o. G2P1001 Active labor at term Elevated blood pressure, likely due to pain response, but will check pre-eclampsia labs Admit to labor and delivery  Epidural for pain  Expectant  management Anticipate NSVD   Thressa ShellerHeather Elmo Rio 10/17/2017,  11:15 PM

## 2017-10-17 NOTE — MAU Note (Signed)
Pt states ctx's started at 0300. She is rating the pain intermittent 10/10 lower abdomen.

## 2017-10-17 NOTE — Patient Instructions (Signed)

## 2017-10-18 ENCOUNTER — Inpatient Hospital Stay (HOSPITAL_COMMUNITY): Payer: Medicaid Other | Admitting: Anesthesiology

## 2017-10-18 ENCOUNTER — Encounter (HOSPITAL_COMMUNITY): Payer: Self-pay

## 2017-10-18 DIAGNOSIS — Z3A39 39 weeks gestation of pregnancy: Secondary | ICD-10-CM

## 2017-10-18 LAB — COMPREHENSIVE METABOLIC PANEL
ALBUMIN: 2.9 g/dL — AB (ref 3.5–5.0)
ALK PHOS: 143 U/L — AB (ref 38–126)
ALT: 18 U/L (ref 14–54)
AST: 23 U/L (ref 15–41)
Anion gap: 10 (ref 5–15)
BUN: 9 mg/dL (ref 6–20)
CO2: 19 mmol/L — ABNORMAL LOW (ref 22–32)
Calcium: 9.5 mg/dL (ref 8.9–10.3)
Chloride: 105 mmol/L (ref 101–111)
Creatinine, Ser: 0.7 mg/dL (ref 0.44–1.00)
GFR calc Af Amer: >60 mL/min (ref >60)
GFR calc non Af Amer: >60 mL/min (ref >60)
GLUCOSE: 83 mg/dL (ref 65–99)
POTASSIUM: 3.7 mmol/L (ref 3.5–5.1)
Sodium: 134 mmol/L — ABNORMAL LOW (ref 135–145)
TOTAL PROTEIN: 7 g/dL (ref 6.5–8.1)
Total Bilirubin: 0.8 mg/dL (ref 0.3–1.2)

## 2017-10-18 LAB — URINALYSIS, ROUTINE W REFLEX MICROSCOPIC
Bilirubin Urine: NEGATIVE
GLUCOSE, UA: NEGATIVE mg/dL
HGB URINE DIPSTICK: NEGATIVE
KETONES UR: NEGATIVE mg/dL
Leukocytes, UA: NEGATIVE
Nitrite: NEGATIVE
PROTEIN: NEGATIVE mg/dL
Specific Gravity, Urine: 1.003 — ABNORMAL LOW (ref 1.005–1.030)
pH: 7 (ref 5.0–8.0)

## 2017-10-18 LAB — PROTEIN / CREATININE RATIO, URINE
Creatinine, Urine: 23 mg/dL
Total Protein, Urine: <6 mg/dL

## 2017-10-18 LAB — TYPE AND SCREEN
ABO/RH(D): O POS
Antibody Screen: NEGATIVE

## 2017-10-18 LAB — ABO/RH: ABO/RH(D): O POS

## 2017-10-18 LAB — RPR: RPR Ser Ql: NONREACTIVE

## 2017-10-18 MED ORDER — LIDOCAINE HCL (PF) 1 % IJ SOLN
INTRAMUSCULAR | Status: DC | PRN
  Administered 2017-10-18: 2 mL
  Administered 2017-10-18: 3 mL
  Administered 2017-10-18: 5 mL

## 2017-10-18 MED ORDER — OXYCODONE HCL 5 MG PO TABS: 10.0000 mg | ORAL_TABLET | ORAL | Status: DC | PRN

## 2017-10-18 MED ORDER — ZOLPIDEM TARTRATE 5 MG PO TABS: 5.0000 mg | ORAL_TABLET | Freq: Every evening | ORAL | Status: DC | PRN

## 2017-10-18 MED ORDER — DIBUCAINE 1 % RE OINT
1.0000 "application " | TOPICAL_OINTMENT | RECTAL | Status: DC | PRN
  Filled 2017-10-18: qty 28

## 2017-10-18 MED ORDER — ACETAMINOPHEN 325 MG PO TABS: 650.0000 mg | ORAL_TABLET | ORAL | Status: DC | PRN

## 2017-10-18 MED ORDER — DIPHENHYDRAMINE HCL 25 MG PO CAPS: 25.0000 mg | ORAL_CAPSULE | Freq: Four times a day (QID) | ORAL | Status: DC | PRN

## 2017-10-18 MED ORDER — PHENYLEPHRINE 40 MCG/ML (10ML) SYRINGE FOR IV PUSH (FOR BLOOD PRESSURE SUPPORT): 80.0000 ug | PREFILLED_SYRINGE | INTRAVENOUS | Status: DC | PRN

## 2017-10-18 MED ORDER — LACTATED RINGERS IV SOLN: 500.0000 mL | Freq: Once | INTRAVENOUS | Status: DC

## 2017-10-18 MED ORDER — ONDANSETRON HCL 4 MG PO TABS: 4.0000 mg | ORAL_TABLET | ORAL | Status: DC | PRN

## 2017-10-18 MED ORDER — BENZOCAINE-MENTHOL 20-0.5 % EX AERO
1.0000 | INHALATION_SPRAY | CUTANEOUS | Status: DC | PRN
Start: 2017-10-18 — End: 2017-10-20
  Filled 2017-10-18: qty 56

## 2017-10-18 MED ORDER — SENNOSIDES-DOCUSATE SODIUM 8.6-50 MG PO TABS
2.0000 | ORAL_TABLET | ORAL | Status: DC
  Administered 2017-10-19 (×2): 2 via ORAL
  Filled 2017-10-18 (×2): qty 2

## 2017-10-18 MED ORDER — OXYCODONE HCL 5 MG PO TABS
5.0000 mg | ORAL_TABLET | ORAL | Status: DC | PRN
  Administered 2017-10-19: 5 mg via ORAL
  Filled 2017-10-18: qty 1

## 2017-10-18 MED ORDER — SIMETHICONE 80 MG PO CHEW: 80.0000 mg | CHEWABLE_TABLET | ORAL | Status: DC | PRN

## 2017-10-18 MED ORDER — WITCH HAZEL-GLYCERIN EX PADS: 1.0000 "application " | MEDICATED_PAD | CUTANEOUS | Status: DC | PRN

## 2017-10-18 MED ORDER — IBUPROFEN 600 MG PO TABS
600.0000 mg | ORAL_TABLET | Freq: Four times a day (QID) | ORAL | Status: DC
  Administered 2017-10-18 – 2017-10-20 (×9): 600 mg via ORAL
  Filled 2017-10-18 (×9): qty 1

## 2017-10-18 MED ORDER — TETANUS-DIPHTH-ACELL PERTUSSIS 5-2.5-18.5 LF-MCG/0.5 IM SUSP
0.5000 mL | Freq: Once | INTRAMUSCULAR | Status: AC
  Administered 2017-10-20: 0.5 mL via INTRAMUSCULAR
  Filled 2017-10-18: qty 0.5

## 2017-10-18 MED ORDER — COCONUT OIL OIL
1.0000 "application " | TOPICAL_OIL | Status: DC | PRN
  Filled 2017-10-18: qty 120

## 2017-10-18 MED ORDER — EPHEDRINE 5 MG/ML INJ: 10.0000 mg | INTRAVENOUS | Status: DC | PRN

## 2017-10-18 MED ORDER — PRENATAL MULTIVITAMIN CH
1.0000 | ORAL_TABLET | Freq: Every day | ORAL | Status: DC
  Administered 2017-10-18 – 2017-10-20 (×3): 1 via ORAL
  Filled 2017-10-18 (×3): qty 1

## 2017-10-18 MED ORDER — ONDANSETRON HCL 4 MG/2ML IJ SOLN: 4.0000 mg | INTRAMUSCULAR | Status: DC | PRN

## 2017-10-18 NOTE — Anesthesia Preprocedure Evaluation (Signed)
Anesthesia Evaluation  Patient identified by MRN, date of birth, ID band Patient awake    Reviewed: Allergy & Precautions, Patient's Chart, lab work & pertinent test results  Airway Mallampati: II  TM Distance: >3 FB     Dental   Pulmonary neg pulmonary ROS,    Pulmonary exam normal        Cardiovascular negative cardio ROS Normal cardiovascular exam     Neuro/Psych negative neurological ROS     GI/Hepatic negative GI ROS, Neg liver ROS,   Endo/Other  negative endocrine ROS  Renal/GU negative Renal ROS     Musculoskeletal   Abdominal   Peds  Hematology negative hematology ROS (+)   Anesthesia Other Findings   Reproductive/Obstetrics (+) Pregnancy                             Lab Results  Component Value Date   WBC 10.3 10/17/2017   HGB 12.2 10/17/2017   HCT 35.1 (L) 10/17/2017   MCV 89.3 10/17/2017   PLT 293 10/17/2017   Lab Results  Component Value Date   CREATININE 0.72 03/16/2017   BUN 12 03/16/2017   NA 136 03/16/2017   K 3.2 (L) 03/16/2017   CL 99 (L) 03/16/2017   CO2 23 03/16/2017    Anesthesia Physical Anesthesia Plan  ASA: II  Anesthesia Plan: Epidural   Post-op Pain Management:    Induction:   PONV Risk Score and Plan: Treatment may vary due to age or medical condition  Airway Management Planned: Natural Airway  Additional Equipment:   Intra-op Plan:   Post-operative Plan:   Informed Consent: I have reviewed the patients History and Physical, chart, labs and discussed the procedure including the risks, benefits and alternatives for the proposed anesthesia with the patient or authorized representative who has indicated his/her understanding and acceptance.     Plan Discussed with:   Anesthesia Plan Comments:         Anesthesia Quick Evaluation

## 2017-10-18 NOTE — Anesthesia Procedure Notes (Signed)
Epidural Patient location during procedure: OB Start time: 10/18/2017 12:04 AM End time: 10/18/2017 12:13 AM  Preanesthetic Checklist Completed: patient identified, site marked, surgical consent, pre-op evaluation, timeout performed, IV checked, risks and benefits discussed and monitors and equipment checked  Epidural Patient position: sitting Prep: site prepped and draped and DuraPrep Patient monitoring: continuous pulse ox and blood pressure Approach: midline Location: L4-L5 Injection technique: LOR air  Needle:  Needle type: Tuohy  Needle gauge: 17 G Needle length: 9 cm and 9 Needle insertion depth: 5 cm cm Catheter type: closed end flexible Catheter size: 19 Gauge Catheter at skin depth: 10 cm Test dose: negative  Assessment Events: blood not aspirated, injection not painful, no injection resistance, negative IV test and no paresthesia

## 2017-10-18 NOTE — Anesthesia Postprocedure Evaluation (Signed)
Anesthesia Post Note  Patient: Julie [name]  Procedure(s) Performed: AN AD HOC LABOR EPIDURAL     Patient location during evaluation: Mother Baby Anesthesia Type: Epidural Level of consciousness: awake, awake and alert, oriented and patient cooperative Pain management: pain level controlled Vital Signs Assessment: post-procedure vital signs reviewed and stable Respiratory status: spontaneous breathing, nonlabored ventilation and respiratory function stable Cardiovascular status: stable Postop Assessment: no headache, no backache, patient able to bend at knees and no apparent nausea or vomiting Anesthetic complications: no    Last Vitals:  Vitals:   10/18/17 0839 10/18/17 0955  BP: (!) 110/53 112/68  Pulse: 70 67  Resp: 20   Temp: 37.4 C 37.1 C  SpO2: 97% 97%    Last Pain:  Vitals:   10/18/17 0955  TempSrc: Oral  PainSc: 0-No pain   Pain Goal:                 Draven Laine L

## 2017-10-18 NOTE — Progress Notes (Signed)
Julie [name] is a 22 y.o. G2P1001 at 5985w2d  admitted for active labor  Subjective:   Objective: BP 120/65   Pulse 66   Temp 98.1 F (36.7 C) (Oral)   Resp 16   Ht 5\' 5"  (1.651 m)   Wt 155 lb (70.3 kg)   LMP 01/26/2017 (Exact Date)   SpO2 100%   BMI 25.79 kg/m  No intake/output data recorded. No intake/output data recorded.  FHT:  FHR: 125 bpm, variability: moderate,  accelerations:  Present,  decelerations:  Absent UC:   regular, every 3-4 minutes SVE:   Dilation: 6 Effacement (%): 90 Station: -2 Exam by:: Thressa ShellerHeather Monty Spicher, CNM  Labs: Lab Results  Component Value Date   WBC 10.3 10/17/2017   HGB 12.2 10/17/2017   HCT 35.1 (L) 10/17/2017   MCV 89.3 10/17/2017   PLT 293 10/17/2017    Assessment / Plan: Spontanesousl labor. No progress from last check. AROM   Labor: AROM Preeclampsia:  NA Fetal Wellbeing:  Category I Pain Control:  Epidural I/D:  n/a Anticipated MOD:  NSVD  Thressa ShellerHeather Aerie Donica 10/18/2017, 2:20 AM

## 2017-10-19 ENCOUNTER — Encounter: Payer: Medicaid Other | Admitting: Obstetrics & Gynecology

## 2017-10-19 LAB — BIRTH TISSUE RECOVERY COLLECTION (PLACENTA DONATION)

## 2017-10-19 NOTE — Lactation Note (Signed)
This note was copied from a baby's chart. Lactation Consultation Note  Patient Name: Julie Julie [name]: 10/19/2017 Reason for consult: Initial assessment;Term;Infant < 6lbs  31 hours old female who is being exclusively BF by his mother, she's a P2. Mom did not BF her first child, but she would like to try to do it with this baby. Baby was nursing when entering the room, but he had a shallow latch; he was also bundled. Assisted with latch in the football position and baby got a deeper one, a few swallows were heard; did STS with mom    Taught her how to hand express, got about 1 ml of colostrum and fed it back to baby. Mom's plan is to pump mostly rather than feed at the breast, praised mom for her efforts of providing breastmilk for her baby. She had WIC during her pregnancy, advised her on how to get a DEBP with WIC. Encouraged mom to keep putting baby on the breast STS at least 8-12 times/day  to give baby an opportunity to cue and to feed on feeding instead of on schedule.   Reviewed BF brochure, BF resources and BF diary, mom is aware of LC services and will call PRN.  Maternal Data Formula Feeding for Exclusion: No Has patient been taught Hand Expression?: Yes Does the patient have breastfeeding experience prior to this delivery?: No  Feeding Feeding Type: Breast Fed Length of feed: 20 min  LATCH Score Latch: Grasps breast easily, tongue down, lips flanged, rhythmical sucking.  Audible Swallowing: A few with stimulation  Type of Nipple: Everted at rest and after stimulation  Comfort (Breast/Nipple): Soft / non-tender  Hold (Positioning): Assistance needed to correctly position infant at breast and maintain latch.  LATCH Score: 8  Interventions Interventions: Breast feeding basics reviewed;Assisted with latch;Skin to skin;Breast massage;Breast compression;Adjust position;Hand express;Expressed milk;Support pillows;Position options  Lactation Tools  Discussed/Used Tools: Other (comment)(spoon) WIC Program: Yes   Consult Status Consult Status: Follow-up Julie [name]: 10/20/17 Follow-up type: In-patient    Julie Julie [name] 10/19/2017, 1:42 PM

## 2017-10-19 NOTE — Progress Notes (Signed)
CSW received consult for hx of marijuana use.  Referral was screened out due to the following: ~MOB had no documented substance use after initial prenatal visit/+UPT. ~MOB had no positive drug screens after initial prenatal visit/+UPT. ~Baby's UDS is negative.  Please consult CSW if current concerns arise or by MOB's request.  CSW will monitor CDS results and make report to Child Protective Services if warranted.   Julie EmoryHannah Kyel Purk LCSW, MSW Clinical Social Work: Optician, dispensingystem [employer] Coverage for :  [phone]

## 2017-10-19 NOTE — Progress Notes (Signed)
POSTPARTUM PROGRESS NOTE  Post Partum Day 1  Subjective:  Julie [name] is a 22 y.o. M5H8469G2P2002 s/p SVD at 4888w2d.  No acute events overnight.  Pt denies problems with ambulating, voiding or po intake.  She denies nausea or vomiting.  Pain is well controlled.  She has had flatus. She has had bowel movement.  Lochia Minimal.   Objective: Blood pressure 120/72, pulse 78, temperature 98.5 F (36.9 C), temperature source Oral, resp. rate 16, height 5\' 5"  (1.651 m), weight 70.3 kg (155 lb), last menstrual period 01/26/2017, SpO2 99 %, unknown if currently breastfeeding.  Physical Exam:  General: alert, cooperative and no distress Chest: no respiratory distress Heart:regular rate, distal pulses intact Abdomen: soft, nontender Uterine Fundus: firm, appropriately tender DVT Evaluation: No calf swelling or tenderness Extremities: no edema  Recent Labs    10/17/17 2322  HGB 12.2  HCT 35.1*    Assessment/Plan: Julie [name] is a 22 y.o. G2X5284G2P2002 s/p SVD at 7288w2d   PPD#1 - Doing well.  No complaints.  Continuing to work with lactation Contraception: depo (outpatient) Feeding: breast Dispo: Plan for discharge tomorrow.   LOS: 2 days   Ames CoupeCharles A McLendon MS3 10/19/2017, 9:31 AM    I spoke with and examined patient and agree with resident/PA/SNM's note and plan of care.  Cathie BeamsFran Cresenzo-Dishmon, CNM 10/19/2017 9:49 AM

## 2017-10-20 NOTE — Lactation Note (Signed)
This note was copied from a baby's chart. Lactation Consultation Note  Patient Name: Julie [name] ZOXWR'UToday's Date: 10/20/2017 Reason for consult: Initial assessment;Term;1st time breastfeeding  7453 hours old female who is now being mostly formula bed by his mother. Mom decided to supplement with formula las night and she won't put baby on the breast because her nipples are sore. Mom is not pumping either she's not interested in BF anymore. Reviewed treatment for sore nipples and also engorgement prevention and treatment. Explained to mom that even if she has decided supplementing with formula any amount of breastmilk will be beneficial for her baby and will avoid experiencing engorgement for her.   Mom is aware of LC services and will contact if needed.  Feeding Feeding Type: Bottle Fed - Formula Nipple Type: Slow - flow  Interventions Interventions: Breast feeding basics reviewed  Lactation Tools Discussed/Used     Consult Status Consult Status: Complete    [name] S Edlyn Rosenburg 10/20/2017, 11:21 AM

## 2017-10-20 NOTE — Plan of Care (Signed)
Patient progressing appropriately. Denies any pain. Voiding and ambulating independently.

## 2017-10-20 NOTE — Discharge Instructions (Signed)
Vaginal Delivery, Care After °Refer to this sheet in the next few weeks. These instructions provide you with information about caring for yourself after vaginal delivery. Your health care provider may also give you more specific instructions. Your treatment has been planned according to current medical practices, but problems sometimes occur. Call your health care provider if you have any problems or questions. °What can I expect after the procedure? °After vaginal delivery, it is common to have: °· Some bleeding from your vagina. °· Soreness in your abdomen, your vagina, and the area of skin between your vaginal opening and your anus (perineum). °· Pelvic cramps. °· Fatigue. ° °Follow these instructions at home: °Medicines °· Take over-the-counter and prescription medicines only as told by your health care provider. °· If you were prescribed an antibiotic medicine, take it as told by your health care provider. Do not stop taking the antibiotic until it is finished. °Driving ° °· Do not drive or operate heavy machinery while taking prescription pain medicine. °· Do not drive for 24 hours if you received a sedative. °Lifestyle °· Do not drink alcohol. This is especially important if you are breastfeeding or taking medicine to relieve pain. °· Do not use tobacco products, including cigarettes, chewing tobacco, or e-cigarettes. If you need help quitting, ask your health care provider. °Eating and drinking °· Drink at least 8 eight-ounce glasses of water every day unless you are told not to by your health care provider. If you choose to breastfeed your baby, you may need to drink more water than this. °· Eat high-fiber foods every day. These foods may help prevent or relieve constipation. High-fiber foods include: °? Whole grain cereals and breads. °? Brown rice. °? Beans. °? Fresh fruits and vegetables. °Activity °· Return to your normal activities as told by your health care provider. Ask your health care provider  what activities are safe for you. °· Rest as much as possible. Try to rest or take a nap when your baby is sleeping. °· Do not lift anything that is heavier than your baby or 10 lb (4.5 kg) until your health care provider says that it is safe. °· Talk with your health care provider about when you can engage in sexual activity. This may depend on your: °? Risk of infection. °? Rate of healing. °? Comfort and desire to engage in sexual activity. °Vaginal Care °· If you have an episiotomy or a vaginal tear, check the area every day for signs of infection. Check for: °? More redness, swelling, or pain. °? More fluid or blood. °? Warmth. °? Pus or a bad smell. °· Do not use tampons or douches until your health care provider says this is safe. °· Watch for any blood clots that may pass from your vagina. These may look like clumps of dark red, brown, or black discharge. °General instructions °· Keep your perineum clean and dry as told by your health care provider. °· Wear loose, comfortable clothing. °· Wipe from front to back when you use the toilet. °· Ask your health care provider if you can shower or take a bath. If you had an episiotomy or a perineal tear during labor and delivery, your health care provider may tell you not to take baths for a certain length of time. °· Wear a bra that supports your breasts and fits you well. °· If possible, have someone help you with household activities and help care for your baby for at least a few days after   you leave the hospital. °· Keep all follow-up visits for you and your baby as told by your health care provider. This is important. °Contact a health care provider if: °· You have: °? Vaginal discharge that has a bad smell. °? Difficulty urinating. °? Pain when urinating. °? A sudden increase or decrease in the frequency of your bowel movements. °? More redness, swelling, or pain around your episiotomy or vaginal tear. °? More fluid or blood coming from your episiotomy or  vaginal tear. °? Pus or a bad smell coming from your episiotomy or vaginal tear. °? A fever. °? A rash. °? Little or no interest in activities you used to enjoy. °? Questions about caring for yourself or your baby. °· Your episiotomy or vaginal tear feels warm to the touch. °· Your episiotomy or vaginal tear is separating or does not appear to be healing. °· Your breasts are painful, hard, or turn red. °· You feel unusually sad or worried. °· You feel nauseous or you vomit. °· You pass large blood clots from your vagina. If you pass a blood clot from your vagina, save it to show to your health care provider. Do not flush blood clots down the toilet without having your health care provider look at them. °· You urinate more than usual. °· You are dizzy or light-headed. °· You have not breastfed at all and you have not had a menstrual period for 12 weeks after delivery. °· You have stopped breastfeeding and you have not had a menstrual period for 12 weeks after you stopped breastfeeding. °Get help right away if: °· You have: °? Pain that does not go away or does not get better with medicine. °? Chest pain. °? Difficulty breathing. °? Blurred vision or spots in your vision. °? Thoughts about hurting yourself or your baby. °· You develop pain in your abdomen or in one of your legs. °· You develop a severe headache. °· You faint. °· You bleed from your vagina so much that you fill two sanitary pads in one hour. °This information is not intended to replace advice given to you by your health care provider. Make sure you discuss any questions you have with your health care provider. °Document Released: 08/18/2000 Document Revised: 02/02/2016 Document Reviewed: 09/05/2015 °Elsevier Interactive Patient Education © 2018 [employer] Inc. ° °

## 2017-10-20 NOTE — Discharge Summary (Signed)
OB Discharge Summary     Patient Name: Julie [name] DOB: 01/08/1996 MRN: [id number]  Date of admission: 10/17/2017 Delivering MD: Thressa ShellerHOGAN, HEATHER D   Date of discharge: 10/20/2017  Admitting diagnosis: 39WKS CTX 1MINS Intrauterine pregnancy: 6464w2d     Secondary diagnosis:  Active Problems:   Normal labor  Additional problems: none     Discharge diagnosis: Term Pregnancy Delivered                                                                                                Post partum procedures:none  Augmentation: none  Complications: None  Hospital course:  Onset of Labor With Vaginal Delivery     22 y.o. yo [id number] at 5364w2d was admitted in Active Labor on 10/17/2017. Patient had an uncomplicated labor course as follows:  Membrane Rupture Time/Date: 2:12 AM ,10/18/2017   Intrapartum Procedures: Episiotomy: None [1]                                         Lacerations:  None [1]  Patient had a delivery of a Viable infant. 10/18/2017  Information for the patient's newborn:  Doroteo BradfordBridges, Boy [place] [[id number]][[id number]]  Delivery Method: Vag-Spont    Pateint had an uncomplicated postpartum course.  She is ambulating, tolerating a regular diet, passing flatus, and urinating well. Patient is discharged home in stable condition on 10/20/17.   Physical exam  Vitals:   10/18/17 2300 10/19/17 0642 10/19/17 1945 10/20/17 0552  BP: 120/72 120/72 136/74 (!) 137/46  Pulse: 74 78 81 76  Resp: 18 16 18 16   Temp: 98.9 F (37.2 C) 98.5 F (36.9 C) 98.8 F (37.1 C) 99 F (37.2 C)  TempSrc: Oral Oral Oral Oral  SpO2:      Weight:      Height:       General: alert, cooperative and no distress Lochia: appropriate Uterine Fundus: firm Incision: N/A DVT Evaluation: No evidence of DVT seen on physical exam. Negative Homan's sign. No cords or calf tenderness. Labs: Lab Results  Component Value Date   WBC 10.3 10/17/2017   HGB 12.2 10/17/2017   HCT 35.1 (L) 10/17/2017   MCV 89.3  10/17/2017   PLT 293 10/17/2017   CMP Latest Ref Rng & Units 10/18/2017  Glucose 65 - 99 mg/dL 83  BUN 6 - 20 mg/dL 9  Creatinine 4.130.44 - 2.441.00 mg/dL 0.100.70  Sodium 272135 - 536145 mmol/L 134(L)  Potassium 3.5 - 5.1 mmol/L 3.7  Chloride 101 - 111 mmol/L 105  CO2 22 - 32 mmol/L 19(L)  Calcium 8.9 - 10.3 mg/dL 9.5  Total Protein 6.5 - 8.1 g/dL 7.0  Total Bilirubin 0.3 - 1.2 mg/dL 0.8  Alkaline Phos 38 - 126 U/L 143(H)  AST 15 - 41 U/L 23  ALT 14 - 54 U/L 18    Discharge instruction: per After Visit Summary and "Baby and Me Booklet".  After visit meds:  Allergies as of 10/20/2017      Reactions  Bee Venom Anaphylaxis      Medication List    STOP taking these medications   promethazine 25 MG tablet Commonly known as:  PHENERGAN     TAKE these medications   FLINSTONES GUMMIES OMEGA-3 DHA PO Take 2 each by mouth daily.       Diet: routine diet  Activity: Advance as tolerated. Pelvic rest for 6 weeks.   Outpatient follow up:6 weeks Follow up Appt: Future Appointments  Date Time Provider Department Center  11/20/2017 10:30 AM Cheral Marker, CNM [facility]   Follow up Visit:No Follow-up on file.  Postpartum contraception: Progesterone only pills  Newborn Data: Live born female  Birth Weight: 6 lb 2.2 oz (2785 g) APGAR: 8, 9  Newborn Delivery   Birth date/time:  10/18/2017 06:05:00 Delivery type:  Vaginal, Spontaneous     Baby Feeding: Breast Disposition:home with mother   10/20/2017 Loni Muse, MD

## 2017-11-06 NOTE — Progress Notes (Signed)
[facility] report for infant's positive CDS for Benzoylecgonine and cocaine.  Ped. Office was also notified of CDS results.   Blaine HamperAngel Boyd-Gilyard, MSW, LCSW Clinical Social Work [phone]

## 2017-11-07 DIAGNOSIS — Z029 Encounter for administrative examinations, unspecified: Secondary | ICD-10-CM

## 2017-11-20 ENCOUNTER — Encounter: Payer: Self-pay | Admitting: Women's Health

## 2017-11-20 ENCOUNTER — Ambulatory Visit (INDEPENDENT_AMBULATORY_CARE_PROVIDER_SITE_OTHER): Payer: Medicaid Other | Admitting: Women's Health

## 2017-11-20 ENCOUNTER — Ambulatory Visit: Payer: Medicaid Other

## 2017-11-20 DIAGNOSIS — Z3202 Encounter for pregnancy test, result negative: Secondary | ICD-10-CM | POA: Diagnosis not present

## 2017-11-20 DIAGNOSIS — Z30013 Encounter for initial prescription of injectable contraceptive: Secondary | ICD-10-CM

## 2017-11-20 LAB — POCT URINE PREGNANCY: Preg Test, Ur: NEGATIVE

## 2017-11-20 MED ORDER — MEDROXYPROGESTERONE ACETATE 150 MG/ML IM SUSP: 150.0000 mg | INTRAMUSCULAR | 3 refills | Status: DC

## 2017-11-20 NOTE — Addendum Note (Signed)
Addended by: Federico FlakeNES, Sallee Hogrefe A on: 11/20/2017 12:13 PM   Modules accepted: Orders

## 2017-11-20 NOTE — Patient Instructions (Signed)

## 2017-11-20 NOTE — Progress Notes (Signed)
   POSTPARTUM VISIT Patient name: Julie [name] MRN [id number]  Date of birth: 05/17/1996 Chief Complaint:   Postpartum Care (wants depo)  History of Present Illness:   Julie [name] is a 22 y.o. 622P2002 African American female being seen today for a postpartum visit. She is 4 weeks postpartum following a spontaneous vaginal delivery at 39.2 gestational weeks. Anesthesia: epidural. I have fully reviewed the prenatal and intrapartum course. Pregnancy uncomplicated. Postpartum course has been uncomplicated. Bleeding no bleeding. Bowel function is normal. Bladder function is normal.  Patient is not sexually active. Last sexual activity: prior to birth of baby.  Contraception method is wants depo.  [place] Postpartum Depression Screening: negative. Score 0.   [facility]  Results were normal .  No LMP recorded.  Baby's course has been uncomplicated. Baby is feeding by bottle.  Review of Systems:   Pertinent items are noted in HPI Denies Abnormal vaginal discharge w/ itching/odor/irritation, headaches, visual changes, shortness of breath, chest pain, abdominal pain, severe nausea/vomiting, or problems with urination or bowel movements. Pertinent History Reviewed:  Reviewed past medical,surgical, obstetrical and family history.  Reviewed problem list, medications and allergies. OB History  Gravida Para Term Preterm AB Living  2 2 2     2   SAB TAB Ectopic Multiple Live Births        0 2    # Outcome Date GA Lbr Len/2nd Weight Sex Delivery Anes PTL Lv  2 Term 10/18/17 6727w2d 20:53 / 00:12 6 lb 2.2 oz (2.785 kg) M Vag-Spont EPI  LIV  1 Term 08/29/12 677w0d  6 lb 3 oz (2.807 kg) F Vag-Spont None N LIV     Birth Comments: System Generated. Please review and update pregnancy details.     Physical Assessment:   Vitals:   11/20/17 1118  BP: 120/80  Pulse: 98  Weight: 155 lb 9.6 oz (70.6 kg)  Body mass index is 25.89 kg/m.       Physical Examination:   General appearance:  alert, well appearing, and in no distress  Mental status: alert, oriented to person, place, and time  Skin: warm & dry   Cardiovascular: normal heart rate noted   Respiratory: normal respiratory effort, no distress   Breasts: deferred, no complaints   Abdomen: soft, non-tender   Pelvic: VULVA: normal appearing vulva with no masses, tenderness or lesions, CERVIX: normal appearing cervix without discharge or lesions, UTERUS: uterus is normal size, shape, consistency and nontender  Rectal: no hemorrhoids  Extremities: no edema       No results found for this or any previous visit (from the past 24 hour(s)).  Assessment & Plan:  1) Postpartum exam 2) 4 wks s/p SVB 3) Bottlefeeding 4) Depression screening 5) Contraception counseling, pt prefers Depo-Provera injections, rx sent, condoms x 2wks  Meds:  Meds ordered this encounter  Medications  . medroxyPROGESTERone (DEPO-PROVERA) 150 MG/ML injection    Sig: Inject 1 mL (150 mg total) into the muscle every 3 (three) months.    Dispense:  1 mL    Refill:  3    Order Specific Question:   Supervising Provider    Answer:   Lazaro ArmsEURE, LUTHER H [[id number]]    Follow-up: Return for today for , Depo injection, then 844yr for physical.   No orders of the defined types were placed in this encounter.   Cheral MarkerKimberly R Khaden Gater CNM, [facility]NP[facility] 11/20/2017 12:08 PM

## 2017-11-21 ENCOUNTER — Ambulatory Visit (INDEPENDENT_AMBULATORY_CARE_PROVIDER_SITE_OTHER): Payer: Medicaid Other | Admitting: *Deleted

## 2017-11-21 DIAGNOSIS — Z3042 Encounter for surveillance of injectable contraceptive: Secondary | ICD-10-CM | POA: Diagnosis not present

## 2017-11-21 MED ORDER — MEDROXYPROGESTERONE ACETATE 150 MG/ML IM SUSP
150.0000 mg | Freq: Once | INTRAMUSCULAR | Status: AC
  Administered 2017-11-21: 150 mg via INTRAMUSCULAR

## 2017-11-21 NOTE — Progress Notes (Signed)
Pt in for depo injection. Seen yesterday for her PP visit. Pt has neg pregnancy test and is not sexually active.

## 2017-11-29 ENCOUNTER — Telehealth: Payer: Self-pay | Admitting: *Deleted

## 2017-11-29 ENCOUNTER — Encounter: Payer: Self-pay | Admitting: *Deleted

## 2018-02-13 ENCOUNTER — Ambulatory Visit: Payer: Medicaid Other

## 2018-02-18 ENCOUNTER — Ambulatory Visit: Payer: Self-pay

## 2018-03-05 ENCOUNTER — Ambulatory Visit (INDEPENDENT_AMBULATORY_CARE_PROVIDER_SITE_OTHER): Payer: Medicaid Other

## 2018-03-05 VITALS — Ht 65.0 in | Wt 162.6 lb

## 2018-03-05 DIAGNOSIS — Z3202 Encounter for pregnancy test, result negative: Secondary | ICD-10-CM

## 2018-03-05 DIAGNOSIS — Z3042 Encounter for surveillance of injectable contraceptive: Secondary | ICD-10-CM

## 2018-03-05 LAB — POCT URINE PREGNANCY: Preg Test, Ur: NEGATIVE

## 2018-03-05 MED ORDER — MEDROXYPROGESTERONE ACETATE 150 MG/ML IM SUSP
150.0000 mg | Freq: Once | INTRAMUSCULAR | Status: AC
  Administered 2018-03-05: 150 mg via INTRAMUSCULAR

## 2018-03-05 NOTE — Progress Notes (Signed)
Pt here for depo 150 mg IM given rt VG. Tolerated well. Return 12 week for next injection.Pad CMA.

## 2018-05-28 ENCOUNTER — Ambulatory Visit: Payer: Medicaid Other

## 2018-05-29 ENCOUNTER — Ambulatory Visit (INDEPENDENT_AMBULATORY_CARE_PROVIDER_SITE_OTHER): Payer: Medicaid Other | Admitting: *Deleted

## 2018-05-29 ENCOUNTER — Encounter: Payer: Self-pay | Admitting: *Deleted

## 2018-05-29 ENCOUNTER — Other Ambulatory Visit: Payer: Self-pay

## 2018-05-29 DIAGNOSIS — Z3202 Encounter for pregnancy test, result negative: Secondary | ICD-10-CM | POA: Diagnosis not present

## 2018-05-29 DIAGNOSIS — Z3042 Encounter for surveillance of injectable contraceptive: Secondary | ICD-10-CM | POA: Diagnosis not present

## 2018-05-29 LAB — POCT URINE PREGNANCY: PREG TEST UR: NEGATIVE

## 2018-05-29 MED ORDER — MEDROXYPROGESTERONE ACETATE 150 MG/ML IM SUSP
150.0000 mg | Freq: Once | INTRAMUSCULAR | Status: AC
  Administered 2018-05-29: 150 mg via INTRAMUSCULAR

## 2018-05-29 NOTE — Progress Notes (Signed)
Pt given depoprovera 150mg  IM left VG without complications. Advised to return in 12 weeks for next injection.

## 2018-06-30 ENCOUNTER — Emergency Department (HOSPITAL_COMMUNITY)
Admission: EM | Admit: 2018-06-30 | Discharge: 2018-06-30 | Disposition: A | Discharge status: Home / Self Care | Payer: Self-pay | Attending: Emergency Medicine | Admitting: Emergency Medicine

## 2018-06-30 ENCOUNTER — Emergency Department (HOSPITAL_COMMUNITY): Payer: Self-pay

## 2018-06-30 ENCOUNTER — Encounter (HOSPITAL_COMMUNITY): Payer: Self-pay | Admitting: Emergency Medicine

## 2018-06-30 ENCOUNTER — Other Ambulatory Visit: Payer: Self-pay

## 2018-06-30 DIAGNOSIS — B9789 Other viral agents as the cause of diseases classified elsewhere: Secondary | ICD-10-CM

## 2018-06-30 DIAGNOSIS — J069 Acute upper respiratory infection, unspecified: Secondary | ICD-10-CM | POA: Insufficient documentation

## 2018-06-30 MED ORDER — IBUPROFEN 400 MG PO TABS
400.0000 mg | ORAL_TABLET | Freq: Once | ORAL | Status: AC
  Administered 2018-06-30: 400 mg via ORAL
  Filled 2018-06-30: qty 1

## 2018-06-30 NOTE — ED Notes (Signed)
Patient transported to X-ray 

## 2018-06-30 NOTE — ED Triage Notes (Signed)
Pt C/O cough that began yesterday. Pt states that she felt hot with chills.

## 2018-06-30 NOTE — ED Provider Notes (Signed)
[facility] EMERGENCY DEPARTMENT Provider Note   CSN: [id number] Arrival date & time: 06/30/18  0543     History   Chief Complaint Chief Complaint  Patient presents with  . Cough    HPI Julie [name] is a 22 y.o. female.  The history is provided by the patient.  Cough  This is a new problem. The current episode started yesterday. The problem occurs every few minutes. The problem has been gradually worsening. The cough is productive of sputum. There has been no fever. Associated symptoms include chills, sore throat, myalgias and shortness of breath. She has tried nothing for the symptoms. She is not a smoker (denies vaping).   Patient presents with cough.  She reports yesterday she began having cough and sore throat.  She reports her cough is now worsening with productive sputum.  She also reports chills, myalgias.  She also reports shortness of breath.  This morning she woke up with cough and chest pain with cough. No vomiting No recent travel  Past Medical History:  Diagnosis Date  . Medical history non-contributory     Patient Active Problem List   Diagnosis Date Noted  . Marijuana use 04/09/2017  . 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 NO PAST SURGERIES       OB History    Gravida  2   Para  2   Term  2   Preterm      AB      Living  2     SAB      TAB      Ectopic      Multiple  0   Live Births  2            Home Medications    Prior to Admission medications   Medication Sig Start Date End Date Taking? Authorizing Provider  medroxyPROGESTERone (DEPO-PROVERA) 150 MG/ML injection Inject 1 mL (150 mg total) into the muscle every 3 (three) months. 11/20/17   Cheral Marker, CNM  Pediatric Multiple Vit-C-FA (FLINSTONES GUMMIES OMEGA-3 DHA PO) Take 2 each by mouth daily.     [provider]    Family History Family History  Problem Relation Age of Onset  . Diabetes Paternal  Grandmother     Social History Social History   Tobacco Use  . Smoking status: Never Smoker  . Smokeless tobacco: Never Used  Substance Use Topics  . Alcohol use: No  . Drug use: No     Allergies   Bee venom   Review of Systems Review of Systems  Constitutional: Positive for chills.  HENT: Positive for sore throat.   Respiratory: Positive for cough and shortness of breath.   Gastrointestinal: Negative for vomiting.  Musculoskeletal: Positive for myalgias.  All other systems reviewed and are negative.    Physical Exam Updated Vital Signs BP 133/83 (BP Location: Left Arm)   Pulse (!) 117   Temp 98.7 F (37.1 C) (Oral)   Resp 17   Wt 77.6 kg   LMP 04/30/2018   SpO2 99%   BMI 28.46 kg/m   Physical Exam  CONSTITUTIONAL: Well developed/well nourished HEAD: Normocephalic/atraumatic EYES: EOMI/PERRL ENMT: Mucous membranes moist, midline no erythema or exudates NECK: supple no meningeal signs SPINE/BACK:entire spine nontender CV: S1/S2 noted, no murmurs/rubs/gallops noted LUNGS: Lungs are clear to auscultation bilaterally, no apparent distress, coughs frequently during exam ABDOMEN: soft, nontender, no rebound or guarding, bowel sounds noted throughout abdomen  GU:no cva tenderness NEURO: Pt is awake/alert/appropriate, moves all extremitiesx4.  No facial droop.   EXTREMITIES: pulses normal/equal, full ROM, no lower extremity edema SKIN: warm, color normal PSYCH: no abnormalities of mood noted, alert and oriented to situation  ED Treatments / Results  Labs (all labs ordered are listed, but only abnormal results are displayed) Labs Reviewed - No data to display  EKG None  Radiology Dg Chest 2 View  Result Date: 06/30/2018 CLINICAL DATA:  22 year old female with cough. EXAM: CHEST - 2 VIEW COMPARISON:  Chest radiograph dated 05/19/2016 FINDINGS: The heart size and mediastinal contours are within normal limits. Both lungs are clear. The visualized skeletal  structures are unremarkable. IMPRESSION: No active cardiopulmonary disease. Electronically Signed   By: Elgie Collard M.D.   On: 06/30/2018 06:41    Procedures Procedures  Medications Ordered in ED Medications  ibuprofen (ADVIL,MOTRIN) tablet 400 mg (400 mg Oral Given 06/30/18 1308)     Initial Impression / Assessment and Plan / ED Course  I have reviewed the triage vital signs and the nursing notes.  Pertinent imaging results that were available during my care of the patient were reviewed by me and considered in my medical decision making (see chart for details).     Pt improved CXR negative Tachycardia improved No hypoxia Probable viral illness We discussed strict ER return precautions  She is appropriate d/c home   Final Clinical Impressions(s) / ED Diagnoses   Final diagnoses:  Viral URI with cough    ED Discharge Orders    None       Zadie Rhine, MD 06/30/18 [phone]

## 2018-07-22 ENCOUNTER — Other Ambulatory Visit: Payer: Medicaid Other | Admitting: Women's Health

## 2018-08-21 ENCOUNTER — Ambulatory Visit: Payer: Medicaid Other

## 2018-08-22 ENCOUNTER — Ambulatory Visit (INDEPENDENT_AMBULATORY_CARE_PROVIDER_SITE_OTHER): Payer: Medicaid Other | Admitting: Adult Health

## 2018-08-22 ENCOUNTER — Encounter: Payer: Self-pay | Admitting: Women's Health

## 2018-08-22 VITALS — BP 139/78 | HR 86 | Ht 65.2 in | Wt 161.4 lb

## 2018-08-22 DIAGNOSIS — N921 Excessive and frequent menstruation with irregular cycle: Secondary | ICD-10-CM | POA: Diagnosis not present

## 2018-08-22 MED ORDER — MEDROXYPROGESTERONE ACETATE 150 MG/ML IM SUSP: 150.0000 mg | INTRAMUSCULAR | 3 refills | Status: DC

## 2018-08-22 NOTE — Progress Notes (Signed)
Patient ID: Julie [name], female   DOB: 09/07/1995, 22 y.o.   MRN: [id number] History of Present Illness: Michaell CowingKanyata is a 22 year old black female in complaining of bleeding on and off since October, she is on depo.   Current Medications, Allergies, Past Medical History, Past Surgical History, Family History and Social History were reviewed in [facility] electronic medical record.     Review of Systems:  +bleeding since October on and off Last depo was 05/29/18 No new sex partners Denies any pain   Physical Exam:BP 139/78 (BP Location: Right Arm, Patient Position: Sitting, Cuff Size: Normal)   Pulse 86   Ht 5' 5.2" (1.656 m)   Wt 161 lb 6.4 oz (73.2 kg)   Breastfeeding No   BMI 26.69 kg/m  General:  Well developed, well nourished, no acute distress Skin:  Warm and dry Pelvic:  External genitalia is normal in appearance, no lesions.  The vagina is normal in appearance,+borwn blood, no odor. Urethra has no lesions or masses. The cervix is bulbous.  Uterus is felt to be normal size, shape, and contour.  No adnexal masses or tenderness noted.Bladder is non tender, no masses felt. Psych:  No mood changes, alert and cooperative,seems happy Fall risk is low. Examination chaperoned by Malachy MoodJanet Young LPN. She declines STD testing.Her depo shot is due, will get her back in am to get, and that should stop bleeding but if not will Rx megace.   Impression: 1. Breakthrough bleeding on Depo-Provera       Plan: Meds ordered this encounter  Medications  . medroxyPROGESTERone (DEPO-PROVERA) 150 MG/ML injection    Sig: Inject 1 mL (150 mg total) into the muscle every 3 (three) months.    Dispense:  1 mL    Refill:  3    Order Specific Question:   Supervising Provider    Answer:   Duane LopeEURE, LUTHER H [[id number]]   Return in 1 day for depo injection

## 2018-08-23 ENCOUNTER — Ambulatory Visit: Payer: Medicaid Other

## 2018-08-26 ENCOUNTER — Ambulatory Visit (INDEPENDENT_AMBULATORY_CARE_PROVIDER_SITE_OTHER): Payer: Medicaid Other | Admitting: *Deleted

## 2018-08-26 ENCOUNTER — Encounter: Payer: Self-pay | Admitting: *Deleted

## 2018-08-26 VITALS — Wt 161.0 lb

## 2018-08-26 DIAGNOSIS — Z3042 Encounter for surveillance of injectable contraceptive: Secondary | ICD-10-CM | POA: Diagnosis not present

## 2018-08-26 DIAGNOSIS — Z3202 Encounter for pregnancy test, result negative: Secondary | ICD-10-CM

## 2018-08-26 LAB — POCT URINE PREGNANCY: Preg Test, Ur: NEGATIVE

## 2018-08-26 MED ORDER — MEDROXYPROGESTERONE ACETATE 150 MG/ML IM SUSP
150.0000 mg | Freq: Once | INTRAMUSCULAR | Status: AC
  Administered 2018-08-26: 150 mg via INTRAMUSCULAR

## 2018-08-26 NOTE — Progress Notes (Signed)
Depo Provera 150mg IM given in right VG with no complications. Pt to return in 12 weeks for next injection.  

## 2018-09-09 ENCOUNTER — Other Ambulatory Visit: Payer: Medicaid Other | Admitting: Women's Health

## 2018-09-15 DIAGNOSIS — Z79899 Other long term (current) drug therapy: Secondary | ICD-10-CM | POA: Insufficient documentation

## 2018-09-15 DIAGNOSIS — Y999 Unspecified external cause status: Secondary | ICD-10-CM | POA: Insufficient documentation

## 2018-09-15 DIAGNOSIS — Y929 Unspecified place or not applicable: Secondary | ICD-10-CM | POA: Insufficient documentation

## 2018-09-15 DIAGNOSIS — S01312A Laceration without foreign body of left ear, initial encounter: Secondary | ICD-10-CM | POA: Insufficient documentation

## 2018-09-15 DIAGNOSIS — Y939 Activity, unspecified: Secondary | ICD-10-CM | POA: Insufficient documentation

## 2018-09-15 DIAGNOSIS — W228XXA Striking against or struck by other objects, initial encounter: Secondary | ICD-10-CM | POA: Insufficient documentation

## 2018-09-16 ENCOUNTER — Emergency Department (HOSPITAL_COMMUNITY)
Admission: EM | Admit: 2018-09-16 | Discharge: 2018-09-16 | Disposition: A | Discharge status: Home / Self Care | Payer: Medicaid Other | Attending: Emergency Medicine | Admitting: Emergency Medicine

## 2018-09-16 ENCOUNTER — Encounter (HOSPITAL_COMMUNITY): Payer: Self-pay | Admitting: Emergency Medicine

## 2018-09-16 ENCOUNTER — Other Ambulatory Visit: Payer: Self-pay

## 2018-09-16 DIAGNOSIS — S01319A Laceration without foreign body of unspecified ear, initial encounter: Secondary | ICD-10-CM

## 2018-09-16 MED ORDER — BACITRACIN-NEOMYCIN-POLYMYXIN 400-5-5000 EX OINT
TOPICAL_OINTMENT | Freq: Once | CUTANEOUS | Status: DC
  Filled 2018-09-16: qty 1

## 2018-09-16 MED ORDER — HYDROGEN PEROXIDE 3 % EX SOLN
CUTANEOUS | Status: AC
  Administered 2018-09-16: 1
  Filled 2018-09-16: qty 473

## 2018-09-16 MED ORDER — LIDOCAINE HCL (PF) 2 % IJ SOLN
INTRAMUSCULAR | Status: AC
  Administered 2018-09-16: 5 mL
  Filled 2018-09-16: qty 10

## 2018-09-16 NOTE — ED Notes (Signed)
ED Provider at bedside. 

## 2018-09-16 NOTE — ED Triage Notes (Signed)
Pt states she hit L. Ear on a cabinet about an hour PTA and states she could not get it to stop bleeding. Bleeding controlled at this time.

## 2018-09-16 NOTE — ED Provider Notes (Signed)
[facility]ERGENCY DEPART[facility] Provider Note   CSN: [id number] Arrival date & time: 09/15/18  2334     History   Chief Complaint Chief Complaint  Patient presents with  . Laceration    HPI Julie [name] is a 23 y.o. female presenting with a laceration to her left ear, occurring this evening when she hit her ear against a sharp corner of a kitchen cabinet.  She initially thought it was just abraded but has been unable to control the bleeding despite pressure application.  She has had no other treatment prior to arrival.  She denies significant pain, also no other injury, no dizziness, headache, n/v.  Her tetanus is current.  The history is provided by the patient.    Past Medical History:  Diagnosis Date  . Medical history non-contributory     Patient Active Problem List   Diagnosis Date Noted  . Marijuana use 04/09/2017  . 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 NO PAST SURGERIES       OB History    Gravida  2   Para  2   Term  2   Preterm      AB      Living  2     SAB      TAB      Ectopic      Multiple  0   Live Births  2            Home Medications    Prior to Admission medications   Medication Sig Start Date End Date Taking? Authorizing Provider  medroxyPROGESTERone (DEPO-PROVERA) 150 MG/ML injection Inject 1 mL (150 mg total) into the muscle every 3 (three) months. 08/22/18   Adline PotterGriffin, Jennifer A, NP  Pediatric Multiple Vit-C-FA (FLINSTONES GUMMIES OMEGA-3 DHA PO) Take 2 each by mouth daily.     [provider]    Family History Family History  Problem Relation Age of Onset  . Diabetes Paternal Grandmother     Social History Social History   Tobacco Use  . Smoking status: Never Smoker  . Smokeless tobacco: Never Used  Substance Use Topics  . Alcohol use: No  . Drug use: No     Allergies   Bee venom   Review of Systems Review of Systems  Constitutional: Negative.    Respiratory: Negative.   Gastrointestinal: Negative.   Skin: Positive for wound.  Neurological: Negative for dizziness, numbness and headaches.     Physical Exam Updated Vital Signs BP 133/84 (BP Location: Right Arm)   Pulse 84   Temp 98.4 F (36.9 C) (Oral)   Resp 16   Ht 5' 5.5" (1.664 m)   Wt 73.9 kg   SpO2 100%   BMI 26.71 kg/m   Physical Exam Constitutional:      Appearance: She is well-developed.  HENT:     Head: Normocephalic.     Left Ear: Tympanic membrane and ear canal normal.     Ears:      Comments: Flap laceration along the superior helix, 4 cm total length, hemostatic, no visible cartilage or injury noted. Cardiovascular:     Rate and Rhythm: Normal rate.  Pulmonary:     Effort: Pulmonary effort is normal.  Skin:    Findings: Laceration present.  Neurological:     Mental Status: She is alert and oriented to person, place, and time.     Sensory: No sensory deficit.  ED Treatments / Results  Labs (all labs ordered are listed, but only abnormal results are displayed) Labs Reviewed - No data to display  EKG None  Radiology No results found.  Procedures Procedures (including critical care time)  LACERATION REPAIR Performed by: Burgess Amor Authorized by: Burgess Amor Consent: Verbal consent obtained. Risks and benefits: risks, benefits and alternatives were discussed Consent given by: patient Patient identity confirmed: provided demographic data Prepped and Draped in normal sterile fashion Wound explored  Laceration Location: left ear  Laceration Length: 4cm  No Foreign Bodies seen or palpated  Anesthesia: local infiltration  Local anesthetic: lidocaine 2% without epinephrine  Anesthetic total: 2 ml  Irrigation method: Safe clens wound spray Amount of cleaning: standard  Skin closure: ethilon 6-0  Number of sutures: 9  Technique: simple interrupted  Patient tolerance: Patient tolerated the procedure well with no  immediate complications.   Medications Ordered in ED Medications  neomycin-bacitracin-polymyxin (NEOSPORIN) ointment packet (has no administration in time range)  hydrogen peroxide 3 % external solution (1 application  Given 09/16/18 0043)  lidocaine (XYLOCAINE) 2 % injection (5 mLs  Given 09/16/18 0043)     Initial Impression / Assessment and Plan / ED Course  I have reviewed the triage vital signs and the nursing notes.  Pertinent labs & imaging results that were available during my care of the patient were reviewed by me and considered in my medical decision making (see chart for details).     Wound care instructions given.  Pt advised to have sutures removed in 7 days,  Return here sooner for any signs of infection including redness, swelling, worse pain or drainage of pus.     Final Clinical Impressions(s) / ED Diagnoses   Final diagnoses:  Complex laceration of ear, initial encounter    ED Discharge Orders    None       Victoriano Lain 09/16/18 1223    Glynn Octave, MD 09/17/18 [phone]

## 2018-09-16 NOTE — Discharge Instructions (Signed)
Keep your wound clean and dry as the wound edges continue to heal.  Get rechecked for any complications such as swelling, redness, increased pain or infection, although this is unlikely to become infected as wounds on the face rarely get infected.  Your stitches will need to come out in 7 days.  You may apply an antibiotic ointment twice daily if desired.
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
## 2018-10-29 ENCOUNTER — Other Ambulatory Visit (HOSPITAL_COMMUNITY)
Admission: RE | Admit: 2018-10-29 | Discharge: 2018-10-29 | Disposition: A | Discharge status: Home / Self Care | Payer: Medicaid Other | Source: Ambulatory Visit | Attending: Adult Health | Admitting: Adult Health

## 2018-10-29 ENCOUNTER — Encounter: Payer: Self-pay | Admitting: Adult Health

## 2018-10-29 ENCOUNTER — Ambulatory Visit (INDEPENDENT_AMBULATORY_CARE_PROVIDER_SITE_OTHER): Payer: Medicaid Other | Admitting: Adult Health

## 2018-10-29 VITALS — BP 124/77 | HR 76 | Ht 65.5 in | Wt 159.0 lb

## 2018-10-29 DIAGNOSIS — Z01419 Encounter for gynecological examination (general) (routine) without abnormal findings: Secondary | ICD-10-CM | POA: Insufficient documentation

## 2018-10-29 DIAGNOSIS — Z0000 Encounter for general adult medical examination without abnormal findings: Secondary | ICD-10-CM | POA: Diagnosis not present

## 2018-10-29 DIAGNOSIS — Z113 Encounter for screening for infections with a predominantly sexual mode of transmission: Secondary | ICD-10-CM | POA: Diagnosis not present

## 2018-10-29 DIAGNOSIS — Z3009 Encounter for other general counseling and advice on contraception: Secondary | ICD-10-CM | POA: Diagnosis not present

## 2018-10-29 DIAGNOSIS — Z3042 Encounter for surveillance of injectable contraceptive: Secondary | ICD-10-CM

## 2018-10-29 NOTE — Progress Notes (Signed)
Patient ID: Julie [name], female   DOB: 1995/11/26, 23 y.o.   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   Current Medications, Allergies, Past Medical History, Past Surgical History, Family History and Social History were reviewed in [employer] record.     Review of Systems: Patient denies any headaches, hearing loss, fatigue, blurred vision, shortness of breath, chest pain, abdominal pain, problems with bowel movements, urination, or intercourse. No joint pain or mood swings. She is happy with depo.    Physical Exam:BP 124/77 (BP Location: Left Arm, Patient Position: Sitting, Cuff Size: Normal)   Pulse 76   Ht 5' 5.5" (1.664 m)   Wt 159 lb (72.1 kg)   Breastfeeding No   BMI 26.06 kg/m  General:  Well developed, well nourished, no acute distress Skin:  Warm and dry Neck:  Midline trachea, normal thyroid, good ROM, no lymphadenopathy Lungs; Clear to auscultation bilaterally Breast:  No dominant palpable mass, retraction, or nipple discharge Cardiovascular: Regular rate and rhythm Abdomen:  Soft, non tender, no hepatosplenomegaly Pelvic:  External genitalia is normal in appearance, no lesions.  The vagina is normal in appearance. Urethra has no lesions or masses. The cervix is bulbous.Pap with GC/CHL performed.  Uterus is felt to be normal size, shape, and contour.  No adnexal masses or tenderness noted.Bladder is non tender, no masses felt. Extremities/musculoskeletal:  No swelling or varicosities noted, no clubbing or cyanosis Psych:  No mood changes, alert and cooperative,seems happy Fall risk is low. PHQ 2 score 0. Examination chaperoned by [name] LPN,  Impression: 1. Encounter for gynecological examination with Papanicolaou smear of cervix   2. Family planning   3. Screening  examination for STD (sexually transmitted disease)   4. Surveillance for Depo-Provera contraception       Plan: Pap with GC/CHL sent Check HIV and RPR Has refills on depo Physical in 1 year Pap in 3 if normal Depo as scheduled.

## 2018-10-30 LAB — HIV ANTIBODY (ROUTINE TESTING W REFLEX): HIV SCREEN 4TH GENERATION: NONREACTIVE

## 2018-10-30 LAB — RPR: RPR: NONREACTIVE

## 2018-10-31 LAB — CYTOLOGY - PAP
Chlamydia: NEGATIVE
Diagnosis: NEGATIVE
Neisseria Gonorrhea: NEGATIVE

## 2018-11-04 NOTE — Telephone Encounter (Signed)
Note sent to nurse. 

## 2018-11-18 ENCOUNTER — Ambulatory Visit: Payer: Self-pay

## 2018-11-19 ENCOUNTER — Ambulatory Visit (INDEPENDENT_AMBULATORY_CARE_PROVIDER_SITE_OTHER): Payer: Medicaid Other | Admitting: *Deleted

## 2018-11-19 ENCOUNTER — Other Ambulatory Visit: Payer: Self-pay

## 2018-11-19 VITALS — BP 131/80 | HR 93

## 2018-11-19 DIAGNOSIS — Z3042 Encounter for surveillance of injectable contraceptive: Secondary | ICD-10-CM | POA: Diagnosis not present

## 2018-11-19 DIAGNOSIS — Z3202 Encounter for pregnancy test, result negative: Secondary | ICD-10-CM | POA: Diagnosis not present

## 2018-11-19 LAB — POCT URINE PREGNANCY: Preg Test, Ur: NEGATIVE

## 2018-11-19 MED ORDER — MEDROXYPROGESTERONE ACETATE 150 MG/ML IM SUSP
150.0000 mg | Freq: Once | INTRAMUSCULAR | Status: AC
  Administered 2018-11-19: 150 mg via INTRAMUSCULAR

## 2018-11-19 NOTE — Progress Notes (Signed)
Pt in for Depo Provera injection. Given in left ventrogluteal. Patient tolerated well.

## 2018-12-04 ENCOUNTER — Other Ambulatory Visit: Payer: Self-pay | Admitting: Women's Health

## 2018-12-04 MED ORDER — MEGESTROL ACETATE 40 MG PO TABS: ORAL_TABLET | ORAL | 1 refills | Status: DC

## 2019-02-11 ENCOUNTER — Ambulatory Visit: Payer: Medicaid Other

## 2019-02-12 ENCOUNTER — Other Ambulatory Visit: Payer: Self-pay

## 2019-02-12 ENCOUNTER — Ambulatory Visit (INDEPENDENT_AMBULATORY_CARE_PROVIDER_SITE_OTHER): Payer: Medicaid Other | Admitting: *Deleted

## 2019-02-12 DIAGNOSIS — Z3042 Encounter for surveillance of injectable contraceptive: Secondary | ICD-10-CM | POA: Diagnosis not present

## 2019-02-12 MED ORDER — MEDROXYPROGESTERONE ACETATE 150 MG/ML IM SUSP
150.0000 mg | Freq: Once | INTRAMUSCULAR | Status: AC
  Administered 2019-02-12: 150 mg via INTRAMUSCULAR

## 2019-02-12 NOTE — Progress Notes (Signed)
Depo Provera 150mg  IM given in left VG with no complications. Pt to return in 12 weeks for next depo injection.

## 2019-02-26 ENCOUNTER — Emergency Department (HOSPITAL_COMMUNITY): Payer: Medicaid Other

## 2019-02-26 ENCOUNTER — Emergency Department (HOSPITAL_COMMUNITY)
Admission: EM | Admit: 2019-02-26 | Discharge: 2019-02-26 | Disposition: A | Discharge status: Home / Self Care | Payer: Medicaid Other | Attending: Emergency Medicine | Admitting: Emergency Medicine

## 2019-02-26 ENCOUNTER — Other Ambulatory Visit: Payer: Self-pay

## 2019-02-26 ENCOUNTER — Encounter (HOSPITAL_COMMUNITY): Payer: Self-pay | Admitting: Emergency Medicine

## 2019-02-26 DIAGNOSIS — Y939 Activity, unspecified: Secondary | ICD-10-CM | POA: Diagnosis not present

## 2019-02-26 DIAGNOSIS — Y929 Unspecified place or not applicable: Secondary | ICD-10-CM | POA: Diagnosis not present

## 2019-02-26 DIAGNOSIS — W108XXA Fall (on) (from) other stairs and steps, initial encounter: Secondary | ICD-10-CM | POA: Insufficient documentation

## 2019-02-26 DIAGNOSIS — Y999 Unspecified external cause status: Secondary | ICD-10-CM | POA: Diagnosis not present

## 2019-02-26 DIAGNOSIS — S99921A Unspecified injury of right foot, initial encounter: Secondary | ICD-10-CM | POA: Diagnosis present

## 2019-02-26 DIAGNOSIS — Z79899 Other long term (current) drug therapy: Secondary | ICD-10-CM | POA: Insufficient documentation

## 2019-02-26 DIAGNOSIS — S92424A Nondisplaced fracture of distal phalanx of right great toe, initial encounter for closed fracture: Secondary | ICD-10-CM | POA: Diagnosis not present

## 2019-02-26 NOTE — Discharge Instructions (Signed)
Please see the information and instructions below regarding your visit.  Your diagnoses today include:  1. Closed nondisplaced fracture of distal phalanx of right great toe, initial encounter     Tests performed today include: See side panel of your discharge paperwork for testing performed today. Vital signs are listed at the bottom of these instructions.   Your x-ray shows a broken toe.  Medications prescribed:    Take any prescribed medications only as prescribed, and any over the counter medications only as directed on the packaging.  I recommend ibuprofen a non-steroidal anti-inflammatory agent (NSAID) for pain. You may take 600 mg every 6 hours as needed for pain. If still requiring this medication around the clock for acute pain after 10 days, please see your primary healthcare provider.  Women who are pregnant, breastfeeding, or planning on becoming pregnant should not take non-steroidal anti-inflammatories such as Advil and Aleve. Tylenol is a safe over the counter pain reliever in pregnant women.  You may combine this medication with Tylenol, 650 mg every 6 hours, so you are receiving something for pain every 3 hours.  This is not a long-term medication unless under the care and direction of your primary provider. Taking this medication long-term and not under the supervision of a healthcare provider could increase the risk of stomach ulcers, kidney problems, and cardiovascular problems such as high blood pressure.    Home care instructions:  Please follow any educational materials contained in this packet.   Follow-up instructions:  Please follow up with Dr. Aline Brochure in one week for the toe fracture.  Return instructions:  Please return to the Emergency Department if you experience worsening symptoms.  Please return the emergency department for any worsening pain, swelling, or loss of sensation. Please return if you have any other emergent concerns.  Additional  Information:   Your vital signs today were: BP (!) 146/82 (BP Location: Right Arm)    Pulse (!) 105    Temp 98.3 F (36.8 C) (Oral)    Resp 18    Ht 5' 5.5" (1.664 m)    Wt 74.8 kg    LMP 02/05/2019 (Approximate)    SpO2 97%    BMI 27.04 kg/m  If your blood pressure (BP) was elevated on multiple readings during this visit above 130 for the top number or above 80 for the bottom number, please have this repeated by your primary care provider within one month. --------------  Thank you for allowing [place] to participate in your care today.

## 2019-02-26 NOTE — ED Provider Notes (Signed)
[facility] EMERGENCY DEPARTMENT Provider Note   CSN: [id number] Arrival date & time: 02/26/19  0347     History   Chief Complaint Chief Complaint  Patient presents with  . Toe Injury    HPI Julie [name] is a 23 y.o. female.     HPI  Patient is a 23 year old female with no significant past medical history presenting for right great toe injury.  Patient reports that she fell down the stairs yesterday after losing her footing.  She denies any syncope before or after the event.  She does report that she hit her head but denies any current pain in her head, visual disturbance, nausea or vomiting, retrograde amnesia, or blood thinner use.  Patient reports that she has had difficulty walking on her right foot Julie since. She took ibuprofen for the pain.   Past Medical History:  Diagnosis Date  . Medical history non-contributory     Patient Active Problem List   Diagnosis Date Noted  . Surveillance for Depo-Provera contraception 10/29/2018  . Screening examination for STD (sexually transmitted disease) 10/29/2018  . Family planning 10/29/2018  . Encounter for gynecological examination with Papanicolaou smear of cervix 10/29/2018  . Marijuana use 04/09/2017  . 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 NO PAST SURGERIES       OB History    Gravida  2   Para  2   Term  2   Preterm      AB      Living  2     SAB      TAB      Ectopic      Multiple  0   Live Births  2            Home Medications    Prior to Admission medications   Medication Sig Start Date End Date Taking? Authorizing Provider  medroxyPROGESTERone (DEPO-PROVERA) 150 MG/ML injection Inject 1 mL (150 mg total) into the muscle every 3 (three) months. 08/22/18   Estill Dooms, NP  megestrol (MEGACE) 40 MG tablet 3x5d, 2x5d, then 1 daily to help control vaginal bleeding. Stop taking when bleeding stops. 12/04/18   Roma Schanz, CNM   Pediatric Multiple Vit-C-FA (FLINSTONES GUMMIES OMEGA-3 DHA PO) Take 2 each by mouth daily.     [provider]    Family History Family History  Problem Relation Age of Onset  . Diabetes Paternal Grandmother     Social History Social History   Tobacco Use  . Smoking status: Never Smoker  . Smokeless tobacco: Never Used  Substance Use Topics  . Alcohol use: No  . Drug use: No     Allergies   Bee venom   Review of Systems Review of Systems  Musculoskeletal: Positive for arthralgias.  Skin: Positive for color change.  Neurological: Negative for weakness and numbness.     Physical Exam Updated Vital Signs BP (!) 146/82 (BP Location: Right Arm)   Pulse (!) 105   Temp 98.3 F (36.8 C) (Oral)   Resp 18   Ht 5' 5.5" (1.664 m)   Wt 74.8 kg   LMP 02/05/2019 (Approximate)   SpO2 97%   BMI 27.04 kg/m   Physical Exam Vitals signs and nursing note reviewed.  Constitutional:      General: She is not in acute distress.    Appearance: She is well-developed. She is not diaphoretic.     Comments: Sitting comfortably  in bed.  HENT:     Head: Normocephalic and atraumatic.  Eyes:     General:        Right eye: No discharge.        Left eye: No discharge.     Conjunctiva/sclera: Conjunctivae normal.     Comments: EOMs normal to gross examination.  Neck:     Musculoskeletal: Normal range of motion.  Cardiovascular:     Rate and Rhythm: Normal rate and regular rhythm.     Comments: Intact, 2+ right DP pulse. Pulmonary:     Comments: Converses comfortably.  No audible wheeze or stridor. Abdominal:     General: There is no distension.  Musculoskeletal: Normal range of motion.        General: Swelling present.     Comments: Patient has ecchymosis over anterior aspect of the of right great toe. She is able to flex/extend great toe without difficulty. Capillary refill <2s  Skin:    General: Skin is warm and dry.  Neurological:     Mental Status: She is alert.      Comments: Cranial nerves intact to gross observation. Patient moves extremities without difficulty.  Psychiatric:        Behavior: Behavior normal.        Thought Content: Thought content normal.        Judgment: Judgment normal.      ED Treatments / Results  Labs (all labs ordered are listed, but only abnormal results are displayed) Labs Reviewed - No data to display  EKG    Radiology Dg Toe Great Right  Result Date: 02/26/2019 CLINICAL DATA:  Status post fall last night EXAM: RIGHT GREAT TOE COMPARISON:  None. FINDINGS: Nondisplaced fracture of the base of the right first distal phalanx. No other fracture or dislocation. No aggressive osseous lesion. Soft tissues are unremarkable. IMPRESSION: Acute nondisplaced fracture of the base of the right first distal phalanx. Electronically Signed   By: Elige KoHetal  Patel   On: 02/26/2019 10:36    Procedures Procedures (including critical care time)  Medications Ordered in ED Medications - No data to display   Initial Impression / Assessment and Plan / ED Course  I have reviewed the triage vital signs and the nursing notes.  Pertinent labs & imaging results that were available during my care of the patient were reviewed by me and considered in my medical decision making (see chart for details).  Clinical Course as of Feb 25 1101  Wed Feb 26, 2019  1039 DG Toe Great Right [ET]    Clinical Course User Index [ET] Delene Ruffinihompson, Elizabeth A, Student-PA       This is a well-appearing 23 year old female with no significant past medical history presenting for right great toe injury.  Radiograph demonstrating nondisplaced fracture of distal phalanx of right great toe.  Buddy taping performed by me.  Patient placed in postop shoe and given crutches for weightbearing as tolerated.  She is instructed to follow-up with orthopedics.  Patient encouraged to use NSAIDs and acetaminophen for pain.  Return precautions given for increasing pain, pallor,  paresthesias.  Patient is in  understanding and agrees with the plan of care.  Final Clinical Impressions(s) / ED Diagnoses   Final diagnoses:  Closed nondisplaced fracture of distal phalanx of right great toe, initial encounter    ED Discharge Orders    None       Delia ChimesMurray, Alyssa B, PA-C 02/26/19 1115    Blane OharaZavitz, Joshua, MD 02/26/19 1537

## 2019-02-26 NOTE — ED Triage Notes (Signed)
Pt c/o RT great toe pain after falling down some steps yesterday.

## 2019-03-16 IMAGING — DX DG CHEST 2V
2 series · 2 of 2 positions shown · non-contrast
Comparison: Chest radiograph dated 05/19/2016

CLINICAL DATA: 22-year-old female with cough.

EXAM:
CHEST - 2 VIEW

[chest pa]
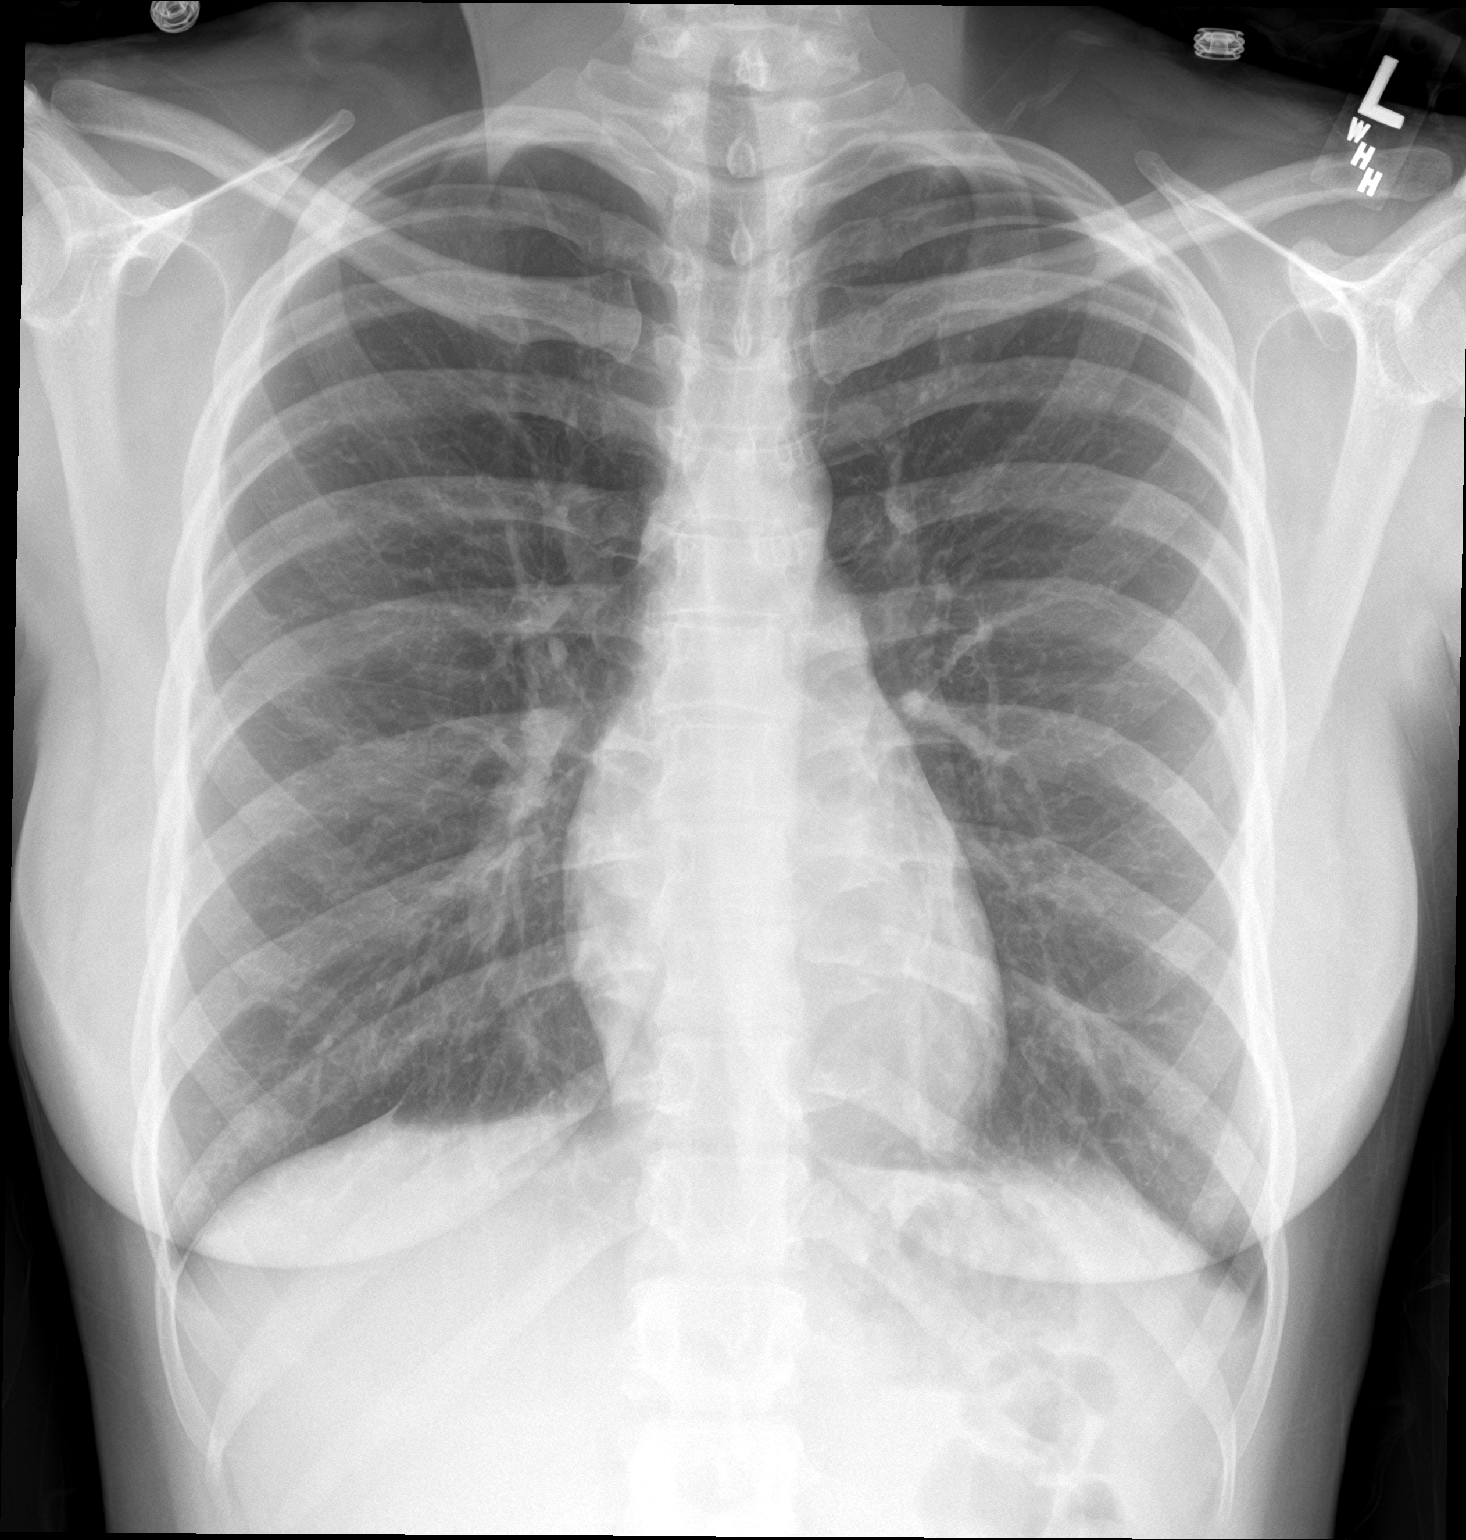

[chest lat]
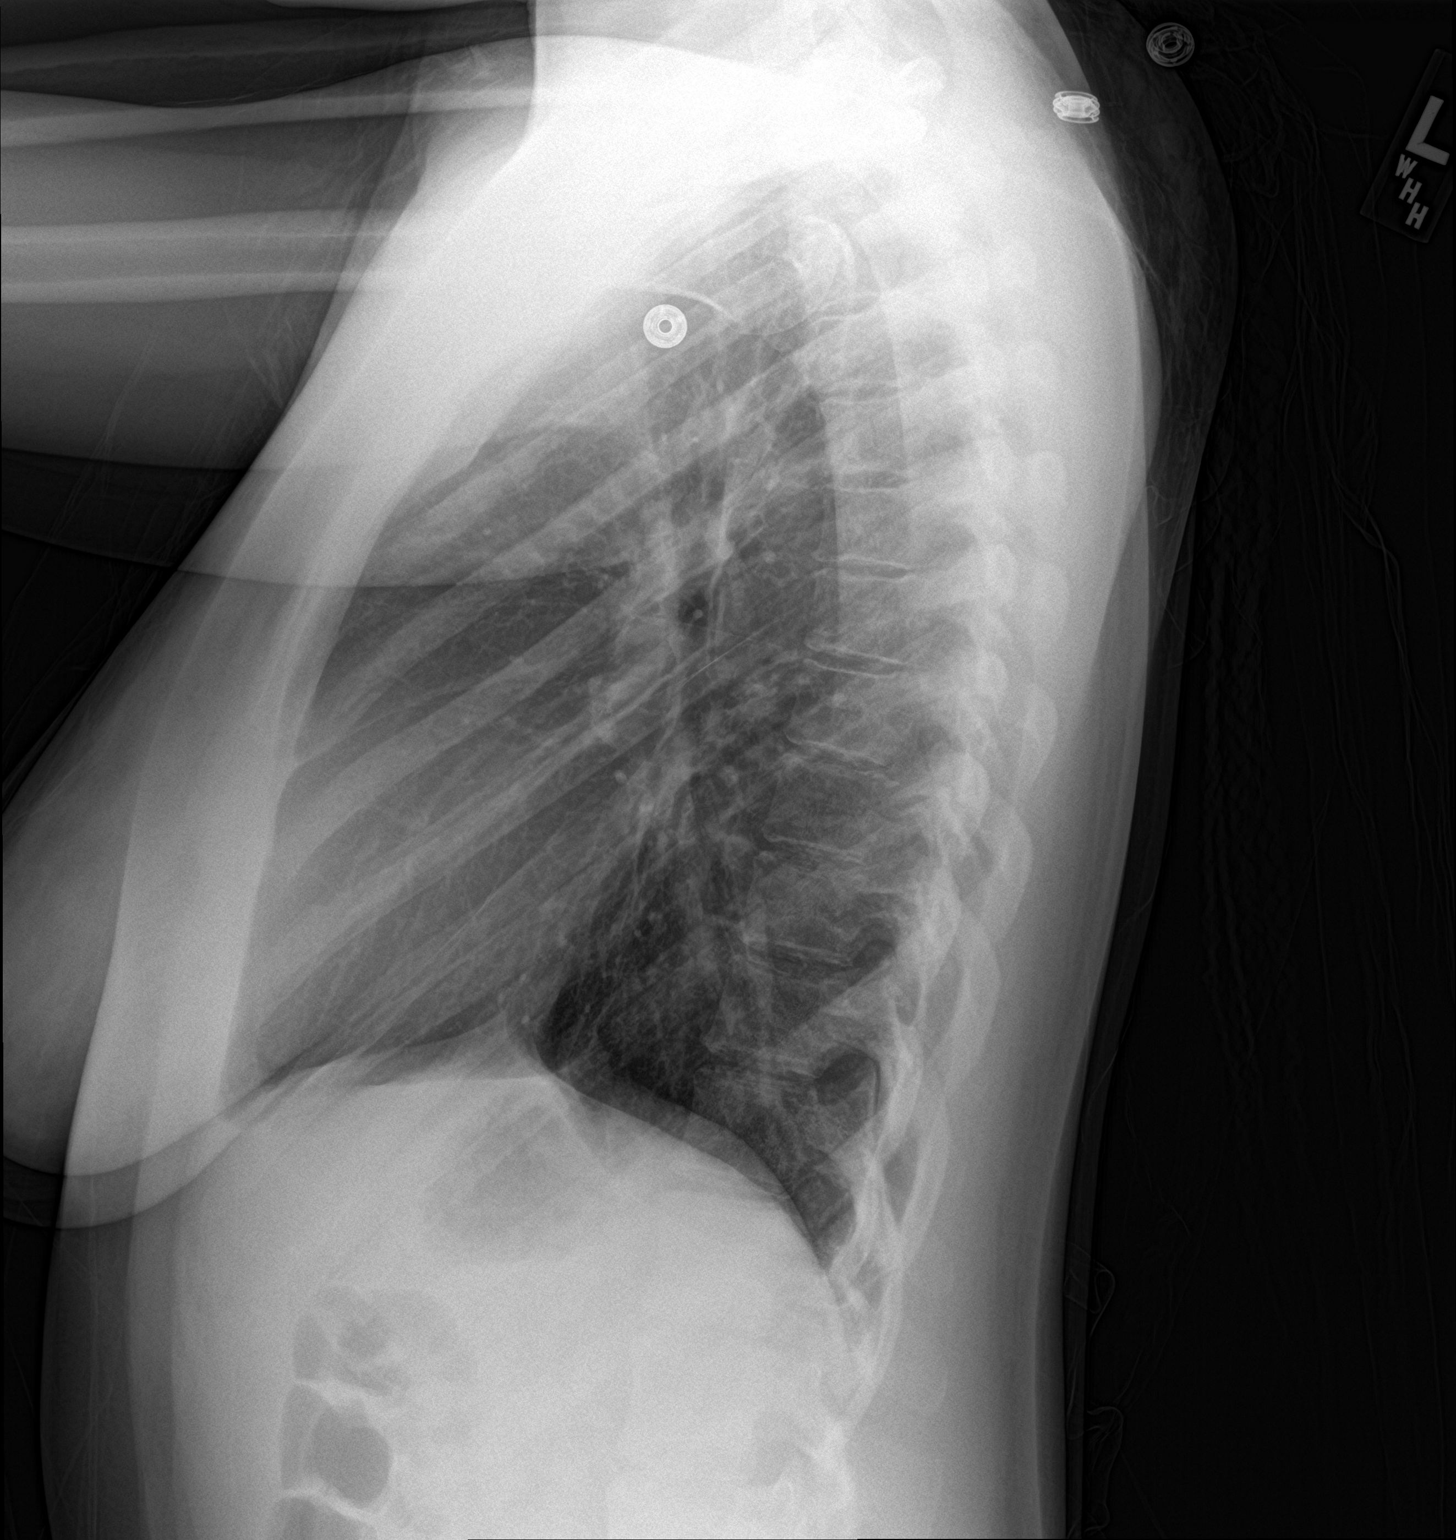

[2 of 2 positions shown; findings below may reference images not displayed]

FINDINGS: The heart size and mediastinal contours are within normal limits.
Both lungs are clear. The visualized skeletal structures are
unremarkable.
IMPRESSION: No active cardiopulmonary disease.

## 2019-05-02 ENCOUNTER — Ambulatory Visit: Payer: Medicaid Other

## 2019-05-05 ENCOUNTER — Ambulatory Visit: Payer: Medicaid Other

## 2019-05-06 ENCOUNTER — Ambulatory Visit: Payer: Medicaid Other

## 2019-05-07 ENCOUNTER — Ambulatory Visit: Payer: Medicaid Other

## 2019-05-09 ENCOUNTER — Other Ambulatory Visit: Payer: Self-pay

## 2019-05-09 ENCOUNTER — Ambulatory Visit (INDEPENDENT_AMBULATORY_CARE_PROVIDER_SITE_OTHER): Payer: Medicaid Other

## 2019-05-09 VITALS — Ht 65.2 in | Wt 157.4 lb

## 2019-05-09 DIAGNOSIS — Z3042 Encounter for surveillance of injectable contraceptive: Secondary | ICD-10-CM

## 2019-05-09 MED ORDER — MEDROXYPROGESTERONE ACETATE 150 MG/ML IM SUSP
150.0000 mg | Freq: Once | INTRAMUSCULAR | Status: AC
  Administered 2019-05-09: 150 mg via INTRAMUSCULAR

## 2019-05-09 NOTE — Progress Notes (Signed)
Pt here for depo injection 150 mg IM given lt VG. Tolerated well. Return 12 weeks for next injection. Pad CMA 

## 2019-08-04 ENCOUNTER — Other Ambulatory Visit: Payer: Self-pay | Admitting: Women's Health

## 2019-08-04 ENCOUNTER — Other Ambulatory Visit: Payer: Self-pay

## 2019-08-04 ENCOUNTER — Ambulatory Visit (INDEPENDENT_AMBULATORY_CARE_PROVIDER_SITE_OTHER): Payer: 59 | Admitting: *Deleted

## 2019-08-04 ENCOUNTER — Other Ambulatory Visit: Payer: Self-pay | Admitting: Adult Health

## 2019-08-04 DIAGNOSIS — Z3042 Encounter for surveillance of injectable contraceptive: Secondary | ICD-10-CM

## 2019-08-04 MED ORDER — MEDROXYPROGESTERONE ACETATE 150 MG/ML IM SUSP
150.0000 mg | Freq: Once | INTRAMUSCULAR | Status: AC
  Administered 2019-08-04: 15:00:00 150 mg via INTRAMUSCULAR

## 2019-08-04 NOTE — Progress Notes (Signed)
   NURSE VISIT- INJECTION  SUBJECTIVE:  Julie [name] is a 23 y.o. [phone] female here for a Depo Provera for contraception/period management. She is a GYN patient.   OBJECTIVE:  There were no vitals taken for this visit.  Appears well, in no apparent distress  Injection administered in: Left deltoid  No orders of the defined types were placed in this encounter.   ASSESSMENT: GYN patient Depo Provera for contraception/period management PLAN: Follow-up: in 11-13 weeks for next Depo   Rolena Infante  08/04/2019 3:08 PM

## 2019-08-18 ENCOUNTER — Other Ambulatory Visit: Payer: Self-pay | Admitting: Women's Health

## 2019-10-27 ENCOUNTER — Ambulatory Visit (INDEPENDENT_AMBULATORY_CARE_PROVIDER_SITE_OTHER): Payer: 59

## 2019-10-27 ENCOUNTER — Other Ambulatory Visit: Payer: Self-pay

## 2019-10-27 VITALS — Ht 65.2 in | Wt 147.0 lb

## 2019-10-27 DIAGNOSIS — Z3042 Encounter for surveillance of injectable contraceptive: Secondary | ICD-10-CM | POA: Diagnosis not present

## 2019-10-27 MED ORDER — MEDROXYPROGESTERONE ACETATE 150 MG/ML IM SUSP
150.0000 mg | Freq: Once | INTRAMUSCULAR | Status: AC
  Administered 2019-10-27: 15:00:00 150 mg via INTRAMUSCULAR

## 2019-10-27 NOTE — Progress Notes (Signed)
   NURSE VISIT- INJECTION  SUBJECTIVE:  Julie [name] is a 24 y.o. [phone] female here for a Depo Provera for contraception/period management. She is a GYN patient.   OBJECTIVE:  Ht 5' 5.2" (1.656 m)   Wt 147 lb (66.7 kg)   BMI 24.31 kg/m   Appears well, in no apparent distress  Injection administered in: Right deltoid  Meds ordered this encounter  Medications  . medroxyPROGESTERone (DEPO-PROVERA) injection 150 mg    ASSESSMENT: GYN patient Depo Provera for contraception/period management PLAN: Follow-up: in 11-13 weeks for next Depo   Rennis Petty  10/27/2019 2:52 PM

## 2020-01-12 ENCOUNTER — Ambulatory Visit: Payer: Medicaid Other

## 2020-01-21 ENCOUNTER — Telehealth: Payer: Self-pay | Admitting: Obstetrics and Gynecology

## 2020-01-21 NOTE — Telephone Encounter (Signed)
Patient states that her insurance has recently changed, the [facility] she was using is out of network with [facility]. She has now switched to [facility] off [address] in [place].

## 2020-01-21 NOTE — Telephone Encounter (Signed)
Telephoned patient at home number and advised changed pharmacy. Advised patient to call back if she needs anything further.

## 2020-01-23 ENCOUNTER — Encounter: Payer: Self-pay | Admitting: *Deleted

## 2020-01-23 ENCOUNTER — Ambulatory Visit (INDEPENDENT_AMBULATORY_CARE_PROVIDER_SITE_OTHER): Payer: 59 | Admitting: *Deleted

## 2020-01-23 ENCOUNTER — Telehealth: Payer: Self-pay | Admitting: *Deleted

## 2020-01-23 DIAGNOSIS — Z3042 Encounter for surveillance of injectable contraceptive: Secondary | ICD-10-CM

## 2020-01-23 DIAGNOSIS — Z308 Encounter for other contraceptive management: Secondary | ICD-10-CM

## 2020-01-23 MED ORDER — MEDROXYPROGESTERONE ACETATE 150 MG/ML IM SUSP: 150.0000 mg | INTRAMUSCULAR | 3 refills | Status: DC

## 2020-01-23 MED ORDER — MEDROXYPROGESTERONE ACETATE 150 MG/ML IM SUSP
150.0000 mg | Freq: Once | INTRAMUSCULAR | Status: AC
  Administered 2020-01-23: 150 mg via INTRAMUSCULAR

## 2020-01-23 NOTE — Telephone Encounter (Signed)
Patient states pharmacy [facility] does not have prescription for Depo Provera. Sent to Cyril Mourning, NP to fill.

## 2020-01-23 NOTE — Progress Notes (Signed)
   NURSE VISIT- INJECTION  SUBJECTIVE:  Julie [name] is a 24 y.o. [id number] female here for a Depo Provera for contraception/period management. She is a GYN patient.   OBJECTIVE:  There were no vitals taken for this visit.  Appears well, in no apparent distress  Injection administered in: Left deltoid  Meds ordered this encounter  Medications  . medroxyPROGESTERone (DEPO-PROVERA) injection 150 mg    ASSESSMENT: GYN patient Depo Provera for contraception/period management PLAN: Follow-up: in 11-13 weeks for next Depo   [name]  01/23/2020 12:12 PM

## 2020-01-23 NOTE — Telephone Encounter (Signed)
Refilled depo 

## 2020-01-23 NOTE — Addendum Note (Signed)
Addended by: Cyril Mourning A on: 01/23/2020 09:45 AM   Modules accepted: Orders

## 2020-04-16 ENCOUNTER — Ambulatory Visit: Payer: 59

## 2020-04-19 ENCOUNTER — Encounter: Payer: Self-pay | Admitting: *Deleted

## 2020-04-19 ENCOUNTER — Ambulatory Visit (INDEPENDENT_AMBULATORY_CARE_PROVIDER_SITE_OTHER): Payer: 59 | Admitting: *Deleted

## 2020-04-19 ENCOUNTER — Other Ambulatory Visit: Payer: Self-pay

## 2020-04-19 DIAGNOSIS — Z308 Encounter for other contraceptive management: Secondary | ICD-10-CM

## 2020-04-19 MED ORDER — MEDROXYPROGESTERONE ACETATE 150 MG/ML IM SUSP
150.0000 mg | Freq: Once | INTRAMUSCULAR | Status: AC
  Administered 2020-04-19: 150 mg via INTRAMUSCULAR

## 2020-04-19 NOTE — Progress Notes (Signed)
   NURSE VISIT- INJECTION  SUBJECTIVE:  Julie [name] is a 24 y.o. G2P2002 female here for a Depo Provera for contraception/period management. She is a GYN patient.   OBJECTIVE:  There were no vitals taken for this visit.  Appears well, in no apparent distress  Injection administered in: Right upper quad. gluteus  Meds ordered this encounter  Medications  . medroxyPROGESTERone (DEPO-PROVERA) injection 150 mg    ASSESSMENT: GYN patient Depo Provera for contraception/period management PLAN: Follow-up: in 11-13 weeks for next Depo   [name]  04/19/2020 10:52 AM

## 2020-05-26 ENCOUNTER — Encounter (HOSPITAL_COMMUNITY): Payer: Self-pay | Admitting: *Deleted

## 2020-05-26 ENCOUNTER — Other Ambulatory Visit: Payer: Self-pay

## 2020-05-26 DIAGNOSIS — R112 Nausea with vomiting, unspecified: Secondary | ICD-10-CM | POA: Diagnosis not present

## 2020-05-26 DIAGNOSIS — R197 Diarrhea, unspecified: Secondary | ICD-10-CM | POA: Diagnosis not present

## 2020-05-26 DIAGNOSIS — E860 Dehydration: Secondary | ICD-10-CM | POA: Diagnosis not present

## 2020-05-26 DIAGNOSIS — U071 COVID-19: Secondary | ICD-10-CM | POA: Diagnosis not present

## 2020-05-26 DIAGNOSIS — R0602 Shortness of breath: Secondary | ICD-10-CM | POA: Diagnosis not present

## 2020-05-26 LAB — CBC
HCT: 41.1 % (ref 36.0–46.0)
Hemoglobin: 13.3 g/dL (ref 12.0–15.0)
MCH: 29.7 pg (ref 26.0–34.0)
MCHC: 32.4 g/dL (ref 30.0–36.0)
MCV: 91.7 fL (ref 80.0–100.0)
Platelets: 234 10*3/uL (ref 150–400)
RBC: 4.48 MIL/uL (ref 3.87–5.11)
RDW: 12.3 % (ref 11.5–15.5)
WBC: 5 10*3/uL (ref 4.0–10.5)
nRBC: 0 % (ref 0.0–0.2)

## 2020-05-26 LAB — SARS CORONAVIRUS 2 BY RT PCR (HOSPITAL ORDER, PERFORMED IN ~~LOC~~ HOSPITAL LAB): SARS Coronavirus 2: POSITIVE — AB

## 2020-05-26 NOTE — ED Triage Notes (Signed)
Pt states shortness of breath, vomiting, fatigue and diarrhea since yesterday. Denies fevers. Took dayquil several hours ago for symptoms. Coughing before vomiting.  Denies COVID exposure.

## 2020-05-27 ENCOUNTER — Telehealth (HOSPITAL_COMMUNITY): Payer: Self-pay | Admitting: Nurse Practitioner

## 2020-05-27 ENCOUNTER — Other Ambulatory Visit (HOSPITAL_COMMUNITY): Payer: Self-pay | Admitting: Nurse Practitioner

## 2020-05-27 ENCOUNTER — Emergency Department (HOSPITAL_COMMUNITY): Payer: 59

## 2020-05-27 ENCOUNTER — Encounter: Payer: Self-pay | Admitting: Nurse Practitioner

## 2020-05-27 ENCOUNTER — Emergency Department (HOSPITAL_COMMUNITY)
Admission: EM | Admit: 2020-05-27 | Discharge: 2020-05-27 | Disposition: A | Discharge status: Home / Self Care | Payer: 59 | Attending: Emergency Medicine | Admitting: Emergency Medicine

## 2020-05-27 DIAGNOSIS — U071 COVID-19: Secondary | ICD-10-CM | POA: Diagnosis not present

## 2020-05-27 DIAGNOSIS — R197 Diarrhea, unspecified: Secondary | ICD-10-CM

## 2020-05-27 DIAGNOSIS — E860 Dehydration: Secondary | ICD-10-CM

## 2020-05-27 DIAGNOSIS — R0602 Shortness of breath: Secondary | ICD-10-CM | POA: Diagnosis not present

## 2020-05-27 DIAGNOSIS — R112 Nausea with vomiting, unspecified: Secondary | ICD-10-CM | POA: Diagnosis not present

## 2020-05-27 LAB — URINALYSIS, ROUTINE W REFLEX MICROSCOPIC
Bacteria, UA: NONE SEEN
Bilirubin Urine: NEGATIVE
Glucose, UA: NEGATIVE mg/dL
Ketones, ur: 80 mg/dL — AB
Leukocytes,Ua: NEGATIVE
Nitrite: NEGATIVE
Protein, ur: 30 mg/dL — AB
Specific Gravity, Urine: 1.033 — ABNORMAL HIGH (ref 1.005–1.030)
pH: 5 (ref 5.0–8.0)

## 2020-05-27 LAB — COMPREHENSIVE METABOLIC PANEL
ALT: 19 U/L (ref 0–44)
AST: 20 U/L (ref 15–41)
Albumin: 4.1 g/dL (ref 3.5–5.0)
Alkaline Phosphatase: 61 U/L (ref 38–126)
Anion gap: 12 (ref 5–15)
BUN: 10 mg/dL (ref 6–20)
CO2: 20 mmol/L — ABNORMAL LOW (ref 22–32)
Calcium: 9.2 mg/dL (ref 8.9–10.3)
Chloride: 106 mmol/L (ref 98–111)
Creatinine, Ser: 0.91 mg/dL (ref 0.44–1.00)
GFR calc Af Amer: >60 mL/min (ref >60)
GFR calc non Af Amer: >60 mL/min (ref >60)
Glucose, Bld: 85 mg/dL (ref 70–99)
Potassium: 3.2 mmol/L — ABNORMAL LOW (ref 3.5–5.1)
Sodium: 138 mmol/L (ref 135–145)
Total Bilirubin: 0.6 mg/dL (ref 0.3–1.2)
Total Protein: 8.2 g/dL — ABNORMAL HIGH (ref 6.5–8.1)

## 2020-05-27 LAB — LIPASE, BLOOD: Lipase: 21 U/L (ref 11–51)

## 2020-05-27 LAB — POC URINE PREG, ED: Preg Test, Ur: NEGATIVE

## 2020-05-27 MED ORDER — AZITHROMYCIN 250 MG PO TABS: ORAL_TABLET | ORAL | 0 refills | Status: DC

## 2020-05-27 MED ORDER — PREDNISONE 20 MG PO TABS: ORAL_TABLET | ORAL | 0 refills | Status: DC

## 2020-05-27 MED ORDER — ONDANSETRON 4 MG PO TBDP: 4.0000 mg | ORAL_TABLET | Freq: Three times a day (TID) | ORAL | 0 refills | Status: DC | PRN

## 2020-05-27 MED ORDER — SODIUM CHLORIDE 0.9 % IV BOLUS
1000.0000 mL | Freq: Once | INTRAVENOUS | Status: AC
  Administered 2020-05-27: 1000 mL via INTRAVENOUS

## 2020-05-27 MED ORDER — ONDANSETRON HCL 4 MG/2ML IJ SOLN
4.0000 mg | Freq: Once | INTRAMUSCULAR | Status: AC
  Administered 2020-05-27: 4 mg via INTRAVENOUS
  Filled 2020-05-27: qty 2

## 2020-05-27 NOTE — Telephone Encounter (Signed)
Called to Discuss with patient about Covid symptoms and the use of regeneron, a monoclonal antibody infusion for those with mild to moderate Covid symptoms and at a high risk of hospitalization.     Pt is qualified for this infusion at the [facility] due to co-morbid conditions and/or a member of an at-risk group.     Unable to reach pt. Left message to return call. Sent mychart message. Sent text.   Keira Bohlin, DNP, AGNP-C [phone] (Infusion Center Hotline)  

## 2020-05-27 NOTE — Progress Notes (Signed)
I connected by phone with Julie [name] on 05/27/2020 at 11:26 AM to discuss the potential use of an new treatment for mild to moderate COVID-19 viral infection in non-hospitalized patients.  This patient is a 24 y.o. female that meets the FDA criteria for Emergency Use Authorization of casirivimab\imdevimab.  Has a (+) direct SARS-CoV-2 viral test result  Has mild or moderate COVID-19   Is ? 25 years of age and weighs ? 40 kg  Is NOT hospitalized due to COVID-19  Is NOT requiring oxygen therapy or requiring an increase in baseline oxygen flow rate due to COVID-19  Is within 10 days of symptom onset  Has at least one of the high risk factor(s) for progression to severe COVID-19 and/or hospitalization as defined in EUA.  Specific high risk criteria : Other high risk medical condition per CDC:  social vulnerability   Onset 9/21.    I have spoken and communicated the following to the patient or parent/caregiver:  1. FDA has authorized the emergency use of casirivimab\imdevimab for the treatment of mild to moderate COVID-19 in adults and pediatric patients with positive results of direct SARS-CoV-2 viral testing who are 58 years of age and older weighing at least 40 kg, and who are at high risk for progressing to severe COVID-19 and/or hospitalization.  2. The significant known and potential risks and benefits of casirivimab\imdevimab, and the extent to which such potential risks and benefits are unknown.  3. Information on available alternative treatments and the risks and benefits of those alternatives, including clinical trials.  4. Patients treated with casirivimab\imdevimab should continue to self-isolate and use infection control measures (e.g., wear mask, isolate, social distance, avoid sharing personal items, clean and disinfect "high touch" surfaces, and frequent handwashing) according to CDC guidelines.   5. The patient or parent/caregiver has the option to accept or refuse  casirivimab\imdevimab .  After reviewing this information with the patient, The patient agreed to proceed with receiving casirivimab\imdevimab infusion and will be provided a copy of the Fact sheet prior to receiving the infusion.[name], DNP, AGNP-C [phone] (Infusion Center Hotline)

## 2020-05-27 NOTE — Discharge Instructions (Addendum)
Drink plenty of fluids (clear liquids) then start a bland diet later this morning such as toast, crackers, jello, [employer] chicken noodle soup. Use the zofran for nausea or vomiting. Take imodium OTC for diarrhea. Avoid milk products until the diarrhea is gone. For your Covid illness, once your nausea is improved start taking zinc 50 mg once a day on a full stomach, vitamin D and vitamin C daily, aspirin 81 mg daily.  If you should get high fever, worsening cough, or get short of breath get the Z-Pak and prednisone filled at the drugstore.  If you do not develop those symptoms you do not have to get it filled.

## 2020-05-27 NOTE — ED Provider Notes (Signed)
[facility] EMERGENCY DEPARTMENT Provider Note   CSN: [id number] Arrival date & time: 05/26/20  2203   Time seen 4:50 AM  History Chief Complaint  Patient presents with  . Emesis    Julie [name] is a 24 y.o. female.  HPI   Patient states she started feeling bad on September 21.  She has had diffuse abdominal pain, nausea and vomiting over 5 times with some bloody streaks but no gross blood and no diarrhea.  She has had a dry cough and clear rhinorrhea without sneezing.  She denies sore throat or loss of taste or smell.  She does have small children in the house someone goes to school and 1 goes to daycare however they have not been sick or been around anybody that she knows of that is sick.  She states she works at home.  She does go to [employer] to take a class and states that when she sat next to had Covid but he was out of class all last week.  She states the last time she sat next to him was this week.   Patient did not take the Covid vaccine   Past Medical History:  Diagnosis Date  . Medical history non-contributory     Patient Active Problem List   Diagnosis Date Noted  . Surveillance for Depo-Provera contraception 10/29/2018  . Screening examination for STD (sexually transmitted disease) 10/29/2018  . Family planning 10/29/2018  . Encounter for gynecological examination with Papanicolaou smear of cervix 10/29/2018  . Marijuana use 04/09/2017  . 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 NO PAST SURGERIES       OB History    Gravida  2   Para  2   Term  2   Preterm      AB      Living  2     SAB      TAB      Ectopic      Multiple  0   Live Births  2           Family History  Problem Relation Age of Onset  . Diabetes Paternal Grandmother     Social History   Tobacco Use  . Smoking status: Never Smoker  . Smokeless tobacco: Never Used  Vaping Use  . Vaping Use: Never used  Substance Use Topics    . Alcohol use: No  . Drug use: No  employed  Home Medications Prior to Admission medications   Medication Sig Start Date End Date Taking? Authorizing Provider  azithromycin (ZITHROMAX) 250 MG tablet Take 2 po the first day then once a day for the next 4 days. 05/27/20   Devoria Albe, MD  medroxyPROGESTERone (DEPO-PROVERA) 150 MG/ML injection Inject 1 mL (150 mg total) into the muscle every 3 (three) months. 01/23/20   Adline Potter, NP  megestrol (MEGACE) 40 MG tablet TAKE 3 TABS X5DAYS, 2 TABS X5DAYS, THEN 1 DAILY UNTIL VAGINAL BLEEDING STOPS 08/19/19   Cheral Marker, CNM  ondansetron (ZOFRAN ODT) 4 MG disintegrating tablet Take 1 tablet (4 mg total) by mouth every 8 (eight) hours as needed for nausea or vomiting. 05/27/20   Devoria Albe, MD  Pediatric Multiple Vit-C-FA (FLINSTONES GUMMIES OMEGA-3 DHA PO) Take 2 each by mouth daily.     [provider]  predniSONE (DELTASONE) 20 MG tablet Take 3 po QD x 3d , then 2 po QD x 3d then 1  po QD x 3d 05/27/20   Devoria AlbeKnapp, Maryfer Tauzin, MD    Allergies    Bee venom  Review of Systems   Review of Systems  All other systems reviewed and are negative.   Physical Exam Updated Vital Signs BP 136/83   Pulse 99   Temp 100.2 F (37.9 C) (Oral)   Resp 18   Ht 5\' 5"  (1.651 m)   Wt 70.3 kg   SpO2 99%   BMI 25.79 kg/m   Physical Exam Vitals and nursing note reviewed.  Constitutional:      General: She is not in acute distress.    Appearance: Normal appearance. She is normal weight. She is not ill-appearing.  HENT:     Head: Normocephalic and atraumatic.     Right Ear: External ear normal.     Left Ear: External ear normal.  Eyes:     Extraocular Movements: Extraocular movements intact.     Conjunctiva/sclera: Conjunctivae normal.  Cardiovascular:     Rate and Rhythm: Normal rate and regular rhythm.     Pulses: Normal pulses.     Heart sounds: Normal heart sounds. No murmur heard.   Pulmonary:     Effort: Pulmonary effort is  normal. No respiratory distress.     Breath sounds: Normal breath sounds.  Abdominal:     General: Abdomen is flat. Bowel sounds are normal.     Palpations: Abdomen is soft.     Tenderness: There is generalized abdominal tenderness. There is no guarding or rebound.  Musculoskeletal:        General: Normal range of motion.     Cervical back: Normal range of motion.  Skin:    General: Skin is warm and dry.  Neurological:     General: No focal deficit present.     Mental Status: She is alert and oriented to person, place, and time.     Cranial Nerves: No cranial nerve deficit.  Psychiatric:        Mood and Affect: Mood normal.        Behavior: Behavior normal.        Thought Content: Thought content normal.     ED Results / Procedures / Treatments   Labs (all labs ordered are listed, but only abnormal results are displayed) Results for orders placed or performed during the hospital encounter of 05/27/20  SARS Coronavirus 2 by RT PCR (hospital order, [facility]) Nasopharyngeal Nasopharyngeal Swab   Specimen: Nasopharyngeal Swab  Result Value Ref Range   SARS Coronavirus 2 POSITIVE (A) NEGATIVE  Lipase, blood  Result Value Ref Range   Lipase 21 11 - 51 U/L  Comprehensive metabolic panel  Result Value Ref Range   Sodium 138 135 - 145 mmol/L   Potassium 3.2 (L) 3.5 - 5.1 mmol/L   Chloride 106 98 - 111 mmol/L   CO2 20 (L) 22 - 32 mmol/L   Glucose, Bld 85 70 - 99 mg/dL   BUN 10 6 - 20 mg/dL   Creatinine, Ser 1.610.91 0.44 - 1.00 mg/dL   Calcium 9.2 8.9 - 09.610.3 mg/dL   Total Protein 8.2 (H) 6.5 - 8.1 g/dL   Albumin 4.1 3.5 - 5.0 g/dL   AST 20 15 - 41 U/L   ALT 19 0 - 44 U/L   Alkaline Phosphatase 61 38 - 126 U/L   Total Bilirubin 0.6 0.3 - 1.2 mg/dL   GFR calc non Af Amer >60 >60 mL/min   GFR calc  Af Amer >60 >60 mL/min   Anion gap 12 5 - 15  CBC  Result Value Ref Range   WBC 5.0 4.0 - 10.5 K/uL   RBC 4.48 3.87 - 5.11 MIL/uL   Hemoglobin 13.3 12.0 -  15.0 g/dL   HCT 26.7 36 - 46 %   MCV 91.7 80.0 - 100.0 fL   MCH 29.7 26.0 - 34.0 pg   MCHC 32.4 30.0 - 36.0 g/dL   RDW 12.4 58.0 - 99.8 %   Platelets 234 150 - 400 K/uL   nRBC 0.0 0.0 - 0.2 %  Urinalysis, Routine w reflex microscopic Urine, Clean Catch  Result Value Ref Range   Color, Urine YELLOW YELLOW   APPearance HAZY (A) CLEAR   Specific Gravity, Urine 1.033 (H) 1.005 - 1.030   pH 5.0 5.0 - 8.0   Glucose, UA NEGATIVE NEGATIVE mg/dL   Hgb urine dipstick MODERATE (A) NEGATIVE   Bilirubin Urine NEGATIVE NEGATIVE   Ketones, ur 80 (A) NEGATIVE mg/dL   Protein, ur 30 (A) NEGATIVE mg/dL   Nitrite NEGATIVE NEGATIVE   Leukocytes,Ua NEGATIVE NEGATIVE   RBC / HPF 21-50 0 - 5 RBC/hpf   WBC, UA 0-5 0 - 5 WBC/hpf   Bacteria, UA NONE SEEN NONE SEEN   Squamous Epithelial / LPF 6-10 0 - 5   Mucus PRESENT   POC urine preg, ED  Result Value Ref Range   Preg Test, Ur NEGATIVE NEGATIVE   Laboratory interpretation all normal except contaminated urinalysis with hematuria, positive Covid test    EKG None  Radiology DG Chest Port 1 View  Result Date: 05/27/2020 CLINICAL DATA:  COVID-19 positivity with shortness of breath EXAM: PORTABLE CHEST 1 VIEW COMPARISON:  06/30/2018 FINDINGS: Cardiac shadow is within normal limits. The lungs are well aerated bilaterally. No focal infiltrate or effusion is seen. No bony abnormality is noted. IMPRESSION: No active disease. Electronically Signed   By: Alcide Clever M.D.   On: 05/27/2020 03:19    Procedures Procedures (including critical care time)  Medications Ordered in ED Medications  sodium chloride 0.9 % bolus 1,000 mL (1,000 mLs Intravenous New Bag/Given 05/27/20 0526)  ondansetron (ZOFRAN) injection 4 mg (4 mg Intravenous Given 05/27/20 0526)    ED Course  I have reviewed the triage vital signs and the nursing notes.  Pertinent labs & imaging results that were available during my care of the patient were reviewed by me and considered in my  medical decision making (see chart for details).    MDM Rules/Calculators/A&P                          Patient presents with nausea, vomiting, mild cough and fever.  Her Covid test is positive.  Her pulse ox is 99% on room air with normal chest x-ray.  Generally with Covid patients we do not give IV fluids however she states she can even drink water without vomiting.  She was given normal saline and Zofran IV.  Recheck at 6:10 AM her IV is almost finished.  Patient states her nausea is better.  She does not feel like she needed a second bag of IV fluids.  She states she feels much stronger now.  She feels ready to be discharged.  Patient's pulse ox is normal at 99%.  She does not have pneumonia on her chest x-ray.  She does not meet criteria for inpatient treatment for her Covid.  She does not have any  criteria to make her eligible for the monoclonal antibody treatment, patient has normal body weight and no underlying medical problems.Audria Nine was evaluated in Emergency Department on 05/27/2020 for the symptoms described in the history of present illness. She was evaluated in the context of the global COVID-19 pandemic, which necessitated consideration that the patient might be at risk for infection with the SARS-CoV-2 virus that causes COVID-19. Institutional protocols and algorithms that pertain to the evaluation of patients at risk for COVID-19 are in a state of rapid change based on information released by regulatory bodies including the CDC and federal and state organizations. These policies and algorithms were followed during the patient's care in the ED.   Final Clinical Impression(s) / ED Diagnoses Final diagnoses:  Nausea vomiting and diarrhea  Dehydration  COVID-19 virus infection    Rx / DC Orders ED Discharge Orders         Ordered    ondansetron (ZOFRAN ODT) 4 MG disintegrating tablet  Every 8 hours PRN        05/27/20 0612    azithromycin (ZITHROMAX) 250 MG tablet         05/27/20 0615    predniSONE (DELTASONE) 20 MG tablet        05/27/20 0615         Plan discharge  Devoria Albe, MD, Concha Pyo, MD 05/27/20 [phone]

## 2020-05-28 ENCOUNTER — Telehealth: Payer: Self-pay

## 2020-05-28 ENCOUNTER — Telehealth: Payer: Self-pay | Admitting: *Deleted

## 2020-05-28 ENCOUNTER — Ambulatory Visit (HOSPITAL_COMMUNITY)
Admission: RE | Admit: 2020-05-28 | Discharge: 2020-05-28 | Disposition: A | Discharge status: Home / Self Care | Payer: 59 | Source: Ambulatory Visit | Attending: Pulmonary Disease | Admitting: Pulmonary Disease

## 2020-05-28 DIAGNOSIS — U071 COVID-19: Secondary | ICD-10-CM

## 2020-05-28 MED ORDER — METHYLPREDNISOLONE SODIUM SUCC 125 MG IJ SOLR: 125.0000 mg | Freq: Once | INTRAMUSCULAR | Status: DC | PRN

## 2020-05-28 MED ORDER — FAMOTIDINE IN NACL 20-0.9 MG/50ML-% IV SOLN: 20.0000 mg | Freq: Once | INTRAVENOUS | Status: DC | PRN

## 2020-05-28 MED ORDER — SODIUM CHLORIDE 0.9 % IV SOLN
1200.0000 mg | Freq: Once | INTRAVENOUS | Status: AC
  Administered 2020-05-28: 1200 mg via INTRAVENOUS

## 2020-05-28 MED ORDER — SODIUM CHLORIDE 0.9 % IV SOLN: INTRAVENOUS | Status: DC | PRN

## 2020-05-28 MED ORDER — ALBUTEROL SULFATE HFA 108 (90 BASE) MCG/ACT IN AERS: 2.0000 | INHALATION_SPRAY | Freq: Once | RESPIRATORY_TRACT | Status: DC | PRN

## 2020-05-28 MED ORDER — DIPHENHYDRAMINE HCL 50 MG/ML IJ SOLN: 50.0000 mg | Freq: Once | INTRAMUSCULAR | Status: DC | PRN

## 2020-05-28 MED ORDER — EPINEPHRINE 0.3 MG/0.3ML IJ SOAJ: 0.3000 mg | Freq: Once | INTRAMUSCULAR | Status: DC | PRN

## 2020-05-28 NOTE — Telephone Encounter (Signed)
Emailed request to [facility] to schedule  NP appointment .   Rihanna Marseille PEC [phone]                                                                                       

## 2020-05-28 NOTE — Discharge Instructions (Signed)

## 2020-05-28 NOTE — Telephone Encounter (Signed)
Transition Care Management Follow-up Telephone Call  Date of discharge and from where: 05/27/2020 from [facility]  How have you been since you were released from the hospital? Patient just picked up rx'ed medications.   Any questions or concerns? No  Items Reviewed:  Did the pt receive and understand the discharge instructions provided? Yes   Medications obtained and verified? Yes   Any new allergies since your discharge? No   Dietary orders reviewed? Yes  Do you have support at home? Yes   Functional Questionnaire: (I = Independent and D = Dependent) ADLs: I  Bathing/Dressing- I  Meal Prep- I  Eating- I  Maintaining continence- I  Transferring/Ambulation- I  Managing Meds- I  Follow up appointments reviewed:   PCP Hospital f/u appt confirmed? No  Pt needs a PCP. Message sent to [facility]  Are transportation arrangements needed? No   Was the patient provided with contact information for the PCP's office or ED? NO  Was to pt encouraged to call back with questions or concerns? Yes

## 2020-05-28 NOTE — Progress Notes (Signed)
  Diagnosis: COVID-19  Physician: Wright, MD  Procedure: Covid Infusion Clinic Med: casirivimab\imdevimab infusion - Provided patient with casirivimab\imdevimab fact sheet for patients, parents and caregivers prior to infusion.  Complications: No immediate complications noted.  Discharge: Discharged home   [name] 05/28/2020   

## 2020-06-01 ENCOUNTER — Emergency Department (HOSPITAL_COMMUNITY)
Admission: EM | Admit: 2020-06-01 | Discharge: 2020-06-01 | Disposition: A | Discharge status: Home / Self Care | Payer: 59 | Attending: Emergency Medicine | Admitting: Emergency Medicine

## 2020-06-01 ENCOUNTER — Encounter (HOSPITAL_COMMUNITY): Payer: Self-pay

## 2020-06-01 ENCOUNTER — Other Ambulatory Visit: Payer: Self-pay

## 2020-06-01 DIAGNOSIS — R197 Diarrhea, unspecified: Secondary | ICD-10-CM | POA: Diagnosis not present

## 2020-06-01 DIAGNOSIS — U071 COVID-19: Secondary | ICD-10-CM | POA: Diagnosis not present

## 2020-06-01 DIAGNOSIS — R112 Nausea with vomiting, unspecified: Secondary | ICD-10-CM | POA: Diagnosis not present

## 2020-06-01 LAB — URINALYSIS, ROUTINE W REFLEX MICROSCOPIC
Bilirubin Urine: NEGATIVE
Glucose, UA: NEGATIVE mg/dL
Hgb urine dipstick: NEGATIVE
Ketones, ur: 20 mg/dL — AB
Leukocytes,Ua: NEGATIVE
Nitrite: NEGATIVE
Protein, ur: NEGATIVE mg/dL
Specific Gravity, Urine: 1.028 (ref 1.005–1.030)
pH: 6 (ref 5.0–8.0)

## 2020-06-01 LAB — POC URINE PREG, ED: Preg Test, Ur: NEGATIVE

## 2020-06-01 LAB — CBC
HCT: 39.4 % (ref 36.0–46.0)
Hemoglobin: 13.6 g/dL (ref 12.0–15.0)
MCH: 29.8 pg (ref 26.0–34.0)
MCHC: 34.5 g/dL (ref 30.0–36.0)
MCV: 86.2 fL (ref 80.0–100.0)
Platelets: 335 10*3/uL (ref 150–400)
RBC: 4.57 MIL/uL (ref 3.87–5.11)
RDW: 11.9 % (ref 11.5–15.5)
WBC: 7.5 10*3/uL (ref 4.0–10.5)
nRBC: 0 % (ref 0.0–0.2)

## 2020-06-01 LAB — COMPREHENSIVE METABOLIC PANEL
ALT: 53 U/L — ABNORMAL HIGH (ref 0–44)
AST: 33 U/L (ref 15–41)
Albumin: 3.8 g/dL (ref 3.5–5.0)
Alkaline Phosphatase: 47 U/L (ref 38–126)
Anion gap: 9 (ref 5–15)
BUN: 11 mg/dL (ref 6–20)
CO2: 24 mmol/L (ref 22–32)
Calcium: 9.7 mg/dL (ref 8.9–10.3)
Chloride: 104 mmol/L (ref 98–111)
Creatinine, Ser: 0.82 mg/dL (ref 0.44–1.00)
GFR calc Af Amer: >60 mL/min (ref >60)
GFR calc non Af Amer: >60 mL/min (ref >60)
Glucose, Bld: 101 mg/dL — ABNORMAL HIGH (ref 70–99)
Potassium: 3.3 mmol/L — ABNORMAL LOW (ref 3.5–5.1)
Sodium: 137 mmol/L (ref 135–145)
Total Bilirubin: 0.8 mg/dL (ref 0.3–1.2)
Total Protein: 8.1 g/dL (ref 6.5–8.1)

## 2020-06-01 LAB — LIPASE, BLOOD: Lipase: 22 U/L (ref 11–51)

## 2020-06-01 MED ORDER — METOCLOPRAMIDE HCL 5 MG/ML IJ SOLN
10.0000 mg | Freq: Once | INTRAMUSCULAR | Status: AC
  Administered 2020-06-01: 10 mg via INTRAMUSCULAR
  Filled 2020-06-01: qty 2

## 2020-06-01 MED ORDER — PROMETHAZINE HCL 25 MG PO TABS: 25.0000 mg | ORAL_TABLET | Freq: Four times a day (QID) | ORAL | 0 refills | Status: DC | PRN

## 2020-06-01 MED ORDER — PANTOPRAZOLE SODIUM 40 MG PO TBEC
40.0000 mg | DELAYED_RELEASE_TABLET | Freq: Once | ORAL | Status: AC
  Administered 2020-06-01: 40 mg via ORAL
  Filled 2020-06-01: qty 1

## 2020-06-01 MED ORDER — PANTOPRAZOLE SODIUM 20 MG PO TBEC: 20.0000 mg | DELAYED_RELEASE_TABLET | Freq: Two times a day (BID) | ORAL | 0 refills | Status: DC

## 2020-06-01 NOTE — Discharge Instructions (Addendum)
Stop taking the ondansetron and start the promethazine as directed.  This may cause drowsiness.  Take the pantoprazole as directed for 1 week.  Small, frequent sips of fluids and small amounts of food at a time or less likely to cause vomiting.  Follow-up with your primary doctor for recheck, return emergency department for any worsening symptoms.

## 2020-06-01 NOTE — ED Triage Notes (Signed)
Pt reports to ED with complaints of generalized abdominal pain feels like knots and stabbing pain started 2 days ago. Pt reports + N/V/D. Pt covid positive diagnosed Wednesday.

## 2020-06-01 NOTE — ED Provider Notes (Signed)
[facility] EMERGENCY DEPARTMENT Provider Note   CSN: [id number] Arrival date & time: 06/01/20  0813     History Chief Complaint  Patient presents with  . Abdominal Pain  . Covid Positive    Julie [name] is a 24 y.o. female.  HPI      CYLIE DOR is a 24 y.o. female who presents to the Emergency Department complaining of diffuse abdominal pain, nausea, vomiting, and diarrhea.  Symptoms have been present for 2 days.  She was here on 05/26/2020 and diagnosed with COVID-19.  Since that time she describes having cramping in intermittent stabbing pains all over her abdomen.  She states her symptoms are worse when she attempts to eat solid foods.  She has been taking Zofran without relief.  She is also currently taking prednisone and azithromycin.  She will reports multiple episodes of watery nonbloody stools and generally feeling weak.  She denies fever, chest pain, shortness of breath, dysuria and flank pain. She is not vaccinated for the Covid 19 virus   Past Medical History:  Diagnosis Date  . Medical history non-contributory     Patient Active Problem List   Diagnosis Date Noted  . Surveillance for Depo-Provera contraception 10/29/2018  . Screening examination for STD (sexually transmitted disease) 10/29/2018  . Family planning 10/29/2018  . Encounter for gynecological examination with Papanicolaou smear of cervix 10/29/2018  . Marijuana use 04/09/2017  . 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 NO PAST SURGERIES       OB History    Gravida  2   Para  2   Term  2   Preterm      AB      Living  2     SAB      TAB      Ectopic      Multiple  0   Live Births  2           Family History  Problem Relation Age of Onset  . Diabetes Paternal Grandmother     Social History   Tobacco Use  . Smoking status: Never Smoker  . Smokeless tobacco: Never Used  Vaping Use  . Vaping Use: Never used  Substance  Use Topics  . Alcohol use: No  . Drug use: No    Home Medications Prior to Admission medications   Medication Sig Start Date End Date Taking? Authorizing Provider  azithromycin (ZITHROMAX) 250 MG tablet Take 2 po the first day then once a day for the next 4 days. 05/27/20   Devoria Albe, MD  medroxyPROGESTERone (DEPO-PROVERA) 150 MG/ML injection Inject 1 mL (150 mg total) into the muscle every 3 (three) months. 01/23/20   Adline Potter, NP  megestrol (MEGACE) 40 MG tablet TAKE 3 TABS X5DAYS, 2 TABS X5DAYS, THEN 1 DAILY UNTIL VAGINAL BLEEDING STOPS 08/19/19   Cheral Marker, CNM  ondansetron (ZOFRAN ODT) 4 MG disintegrating tablet Take 1 tablet (4 mg total) by mouth every 8 (eight) hours as needed for nausea or vomiting. 05/27/20   Devoria Albe, MD  Pediatric Multiple Vit-C-FA (FLINSTONES GUMMIES OMEGA-3 DHA PO) Take 2 each by mouth daily.     [provider]  predniSONE (DELTASONE) 20 MG tablet Take 3 po QD x 3d , then 2 po QD x 3d then 1 po QD x 3d 05/27/20   Devoria Albe, MD    Allergies    Bee venom  Review of  Systems   Review of Systems  Constitutional: Positive for appetite change. Negative for chills and fever.  HENT: Negative for congestion, rhinorrhea and sore throat.   Respiratory: Negative for cough, shortness of breath and wheezing.   Cardiovascular: Negative for chest pain.  Gastrointestinal: Positive for abdominal pain, diarrhea, nausea and vomiting. Negative for abdominal distention and blood in stool.  Genitourinary: Negative for decreased urine volume, dysuria, flank pain, vaginal bleeding and vaginal discharge.  Musculoskeletal: Positive for myalgias. Negative for back pain.  Skin: Negative for color change and rash.  Neurological: Positive for weakness (generalized weakness). Negative for dizziness, syncope, numbness and headaches.  Hematological: Negative for adenopathy.  Psychiatric/Behavioral: Negative for confusion.    Physical Exam Updated Vital  Signs BP (!) 142/101 (BP Location: Right Arm)   Pulse 88   Temp 98.4 F (36.9 C) (Oral)   Resp 18   Ht 5\' 5"  (1.651 m)   Wt 70.3 kg   SpO2 97%   BMI 25.79 kg/m   Physical Exam Vitals and nursing note reviewed.  Constitutional:      General: She is not in acute distress.    Appearance: She is well-developed.  HENT:     Head: Atraumatic.     Mouth/Throat:     Pharynx: No oropharyngeal exudate or posterior oropharyngeal erythema.     Comments: Mucous membranes slightly dry Cardiovascular:     Rate and Rhythm: Normal rate and regular rhythm.     Pulses: Normal pulses.  Pulmonary:     Effort: Pulmonary effort is normal. No respiratory distress.     Breath sounds: Normal breath sounds. No wheezing.  Chest:     Chest wall: No tenderness.  Abdominal:     Palpations: Abdomen is soft.     Tenderness: There is abdominal tenderness (mild ttp of the entire andomen. soft, no guardign or rebound tenderness. ). There is no right CVA tenderness or left CVA tenderness.  Musculoskeletal:        General: Normal range of motion.  Lymphadenopathy:     Cervical: No cervical adenopathy.  Skin:    General: Skin is warm.     Capillary Refill: Capillary refill takes less than 2 seconds.     Findings: No rash.  Neurological:     General: No focal deficit present.     Mental Status: She is alert.     Sensory: No sensory deficit.     Motor: No weakness.     ED Results / Procedures / Treatments   Labs (all labs ordered are listed, but only abnormal results are displayed) Labs Reviewed  COMPREHENSIVE METABOLIC PANEL - Abnormal; Notable for the following components:      Result Value   Potassium 3.3 (*)    Glucose, Bld 101 (*)    ALT 53 (*)    All other components within normal limits  URINALYSIS, ROUTINE W REFLEX MICROSCOPIC - Abnormal; Notable for the following components:   Ketones, ur 20 (*)    All other components within normal limits  LIPASE, BLOOD  CBC  POC URINE PREG, ED     EKG None  Radiology No results found.  Procedures Procedures (including critical care time)  Medications Ordered in ED Medications  pantoprazole (PROTONIX) EC tablet 40 mg (has no administration in time range)  metoCLOPramide (REGLAN) injection 10 mg (has no administration in time range)    ED Course  I have reviewed the triage vital signs and the nursing notes.  Pertinent labs & imaging  results that were available during my care of the patient were reviewed by me and considered in my medical decision making (see chart for details).    MDM Rules/Calculators/A&P                          Patient here with crampy, diffuse abdominal pain with nausea vomiting and diarrhea. Covid positive on 05/27/2020. Symptoms began shortly after diagnosis. Overall she is well-appearing and nontoxic. Mildly hypertensive, afebrile without hypoxia tachycardia or tachypnea. On exam, she has very mild diffuse abdominal tenderness without guarding or rebound. No peritoneal signs. Doubt acute abdominal process. I do not feel there is an indication for abdominal imaging at this time. Urine sample appears dark although she does not clinically appear dehydrated. I doubt acute abdominal process, symptoms are likely related to her recent Covid diagnosis. She has been taking Zofran without relief of her vomiting. We will try Reglan and oral fluid challenge.  Laboratory studies reviewed by me, mild hypokalemia, expected given patient's recent vomiting and diarrhea. Urinalysis without evidence of infection. Remaining labs are reassuring.  On recheck, patient appears to be resting comfortably. She states her symptoms have greatly improved. No vomiting or diarrhea during her visit. She has tolerated fluids without difficulty and states she is ready for discharge home. I feel this is appropriate. I will switch her from Zofran to oral promethazine and prescription provided for Protonix. Return precautions  discussed.  UDELL BLASINGAME was evaluated in Emergency Department on 06/01/2020 for the symptoms described in the history of present illness. She was evaluated in the context of the global COVID-19 pandemic, which necessitated consideration that the patient might be at risk for infection with the SARS-CoV-2 virus that causes COVID-19. Institutional protocols and algorithms that pertain to the evaluation of patients at risk for COVID-19 are in a state of rapid change based on information released by regulatory bodies including the CDC and federal and state organizations. These policies and algorithms were followed during the patient's care in the ED.   Final Clinical Impression(s) / ED Diagnoses Final diagnoses:  Nausea vomiting and diarrhea  COVID-19 virus infection    Rx / DC Orders ED Discharge Orders    None       Rosey Bath 06/01/20 1252    Linwood Dibbles, MD 06/02/20 1258

## 2020-06-02 ENCOUNTER — Telehealth: Payer: Self-pay | Admitting: *Deleted

## 2020-06-02 NOTE — Telephone Encounter (Signed)
Transition Care Management Follow-up Telephone Call  Date of discharge and from where: 06/01/20 - [facility]  How have you been since you were released from the hospital? "Feeling much better, the new medication is making a difference"  Any questions or concerns? No  Items Reviewed:  Did the pt receive and understand the discharge instructions provided? Yes   Medications obtained and verified? Yes   Any new allergies since your discharge? No   Dietary orders reviewed? Yes  Do you have support at home? Yes   Functional Questionnaire: (I = Independent and D = Dependent) ADLs: I  Bathing/Dressing- I  Meal Prep- I  Eating- I  Maintaining continence- I  Transferring/Ambulation- I  Managing Meds- I  Follow up appointments reviewed:   PCP Hospital f/u appt confirmed? No  - PEC Team to address  Specialist Hospital f/u appt confirmed? No    Are transportation arrangements needed? No   If their condition worsens, is the pt aware to call PCP or go to the Emergency Dept.? Yes  Was the patient provided with contact information for the PCP's office or ED? Yes  Was to pt encouraged to call back with questions or concerns? Yes

## 2020-06-03 ENCOUNTER — Telehealth: Payer: Self-pay | Admitting: *Deleted

## 2020-06-03 NOTE — Telephone Encounter (Signed)
Emailed request to [name] to schedule  NP appointment .   Julie [name] PEC [phone]                                                                                       

## 2020-07-12 ENCOUNTER — Ambulatory Visit: Payer: 59

## 2020-07-19 ENCOUNTER — Ambulatory Visit (INDEPENDENT_AMBULATORY_CARE_PROVIDER_SITE_OTHER): Payer: 59 | Admitting: *Deleted

## 2020-07-19 ENCOUNTER — Other Ambulatory Visit: Payer: Self-pay

## 2020-07-19 DIAGNOSIS — Z3042 Encounter for surveillance of injectable contraceptive: Secondary | ICD-10-CM | POA: Diagnosis not present

## 2020-07-19 MED ORDER — MEDROXYPROGESTERONE ACETATE 150 MG/ML IM SUSP
150.0000 mg | Freq: Once | INTRAMUSCULAR | Status: AC
  Administered 2020-07-19: 150 mg via INTRAMUSCULAR

## 2020-07-19 NOTE — Progress Notes (Signed)
   NURSE VISIT- INJECTION  SUBJECTIVE:  Julie [name] is a 24 y.o. [id number] female here for a Depo Provera for contraception/period management. She is a GYN patient.   OBJECTIVE:  There were no vitals taken for this visit.  Appears well, in no apparent distress  Injection administered in: Left upper quad. gluteus  Meds ordered this encounter  Medications  . medroxyPROGESTERone (DEPO-PROVERA) injection 150 mg    ASSESSMENT: GYN patient Depo Provera for contraception/period management PLAN: Follow-up: in 11-13 weeks for next Depo   Julie [name]  07/19/2020 11:32 AM

## 2020-07-28 ENCOUNTER — Ambulatory Visit: Payer: 59 | Admitting: Internal Medicine

## 2020-08-31 ENCOUNTER — Telehealth: Payer: Self-pay | Admitting: Women's Health

## 2020-08-31 NOTE — Telephone Encounter (Signed)
Pt requesting refill for megestrol (MEGACE) 40 MG tablet  Please send to [facility]  Please advise & Notify pt

## 2020-09-01 MED ORDER — MEGESTROL ACETATE 40 MG PO TABS: ORAL_TABLET | ORAL | 0 refills | Status: DC

## 2020-09-01 NOTE — Telephone Encounter (Signed)
Will refill megace 

## 2020-09-01 NOTE — Addendum Note (Signed)
Addended by: Cyril Mourning A on: 09/01/2020 01:43 PM   Modules accepted: Orders

## 2020-10-07 ENCOUNTER — Other Ambulatory Visit: Payer: 59 | Admitting: Adult Health

## 2020-10-07 ENCOUNTER — Other Ambulatory Visit: Payer: Self-pay

## 2020-10-07 ENCOUNTER — Ambulatory Visit (INDEPENDENT_AMBULATORY_CARE_PROVIDER_SITE_OTHER): Payer: 59 | Admitting: *Deleted

## 2020-10-07 DIAGNOSIS — Z308 Encounter for other contraceptive management: Secondary | ICD-10-CM | POA: Diagnosis not present

## 2020-10-07 MED ORDER — MEDROXYPROGESTERONE ACETATE 150 MG/ML IM SUSP
150.0000 mg | Freq: Once | INTRAMUSCULAR | Status: AC
  Administered 2020-10-07: 150 mg via INTRAMUSCULAR

## 2020-10-07 NOTE — Progress Notes (Signed)
   NURSE VISIT- INJECTION  SUBJECTIVE:  Julie [name] is a 25 y.o. G56P2002 female here for a Depo Provera for contraception/period management. She is a GYN patient.   OBJECTIVE:  There were no vitals taken for this visit.  Appears well, in no apparent distress  Injection administered in: Right upper quad. gluteus  Meds ordered this encounter  Medications  . medroxyPROGESTERone (DEPO-PROVERA) injection 150 mg    ASSESSMENT: GYN patient Depo Provera for contraception/period management PLAN: Follow-up: in 11-13 weeks for next Depo   [name]  10/07/2020 10:30 AM

## 2020-10-08 ENCOUNTER — Other Ambulatory Visit: Payer: 59 | Admitting: Adult Health

## 2020-10-11 ENCOUNTER — Ambulatory Visit: Payer: 59

## 2020-11-15 ENCOUNTER — Other Ambulatory Visit: Payer: Self-pay

## 2020-11-15 ENCOUNTER — Encounter: Payer: Self-pay | Admitting: Adult Health

## 2020-11-15 ENCOUNTER — Ambulatory Visit (INDEPENDENT_AMBULATORY_CARE_PROVIDER_SITE_OTHER): Payer: 59 | Admitting: Adult Health

## 2020-11-15 ENCOUNTER — Other Ambulatory Visit (HOSPITAL_COMMUNITY)
Admission: RE | Admit: 2020-11-15 | Discharge: 2020-11-15 | Disposition: A | Discharge status: Home / Self Care | Payer: 59 | Source: Ambulatory Visit | Attending: Adult Health | Admitting: Adult Health

## 2020-11-15 VITALS — BP 137/65 | HR 82 | Ht 65.75 in | Wt 156.0 lb

## 2020-11-15 DIAGNOSIS — Z01419 Encounter for gynecological examination (general) (routine) without abnormal findings: Secondary | ICD-10-CM

## 2020-11-15 DIAGNOSIS — Z113 Encounter for screening for infections with a predominantly sexual mode of transmission: Secondary | ICD-10-CM | POA: Insufficient documentation

## 2020-11-15 DIAGNOSIS — Z3042 Encounter for surveillance of injectable contraceptive: Secondary | ICD-10-CM | POA: Diagnosis not present

## 2020-11-15 MED ORDER — MEDROXYPROGESTERONE ACETATE 150 MG/ML IM SUSP: 150.0000 mg | INTRAMUSCULAR | 3 refills | Status: DC

## 2020-11-15 NOTE — Progress Notes (Signed)
Patient ID: ARIELLA VOIT, female   DOB: December 28, 1995, 25 y.o.   MRN: [id number] History of Present Illness: [place] is a 25 year old black female,single, G2P2 in for a well woman gyn exam, she had negative pap 10/29/2018.    Current Medications, Allergies, Past Medical History, Past Surgical History, Family History and Social History were reviewed in [employer] record.     Review of Systems: Patient denies any headaches, hearing loss, fatigue, blurred vision, shortness of breath, chest pain, abdominal pain, problems with bowel movements, urination, or intercourse. No joint pain or mood swings. She is having night sweats     Physical Exam:BP 137/65 (BP Location: Left Arm, Patient Position: Sitting, Cuff Size: Normal)   Pulse 82   Ht 5' 5.75" (1.67 m)   Wt 156 lb (70.8 kg)   LMP 10/12/2020 (Approximate)   BMI 25.37 kg/m  General:  Well developed, well nourished, no acute distress Skin:  Warm and dry Neck:  Midline trachea, normal thyroid, good ROM, no lymphadenopathy Lungs; Clear to auscultation bilaterally Breast:  No dominant palpable mass, retraction, or nipple discharge Cardiovascular: Regular rate and rhythm Abdomen:  Soft, non tender, no hepatosplenomegaly Pelvic:  External genitalia is normal in appearance, no lesions.  The vagina is normal in appearance. Urethra has no lesions or masses. The cervix is bulbous. CV swab obtained. Uterus is felt to be normal size, shape, and contour.  No adnexal masses or tenderness noted.Bladder is non tender, no masses felt. Extremities/musculoskeletal:  No swelling or varicosities noted, no clubbing or cyanosis Psych:  No mood changes, alert and cooperative,seems happy AA is 0 Fall risk is low PHQ 9 score is 0 GAD 7 score is 0  [facility] - 11/15/20 1213      Pregnancy Intention Screening   Does the patient want to become pregnant in the next year? No    Does the patient's partner want to become pregnant in the next  year? No    Would the patient like to discuss contraceptive options today? No      Contraception Wrap Up   Current Method Hormonal Injection    End Method Hormonal Injection    Contraception Counseling Provided No         Examination chaperoned by [name] LPN  Impression and Plan: 1. Encounter for well woman exam with routine gynecological exam  -pap and physical in 1 year  2. Encounter for surveillance of injectable contraceptive Depo due in April, as appt Meds ordered this encounter  Medications  . medroxyPROGESTERone (DEPO-PROVERA) 150 MG/ML injection    Sig: Inject 1 mL (150 mg total) into the muscle every 3 (three) months.    Dispense:  1 mL    Refill:  3    Order Specific Question:   Supervising Provider    Answer:   EURE, LUTHER H [[id number]]    3. Screening examination for STD (sexually transmitted disease) CV swab sent for GC/CHL and trich She declines HIV,RPR

## 2020-11-16 LAB — CERVICOVAGINAL ANCILLARY ONLY
Chlamydia: NEGATIVE
Comment: NEGATIVE
Comment: NEGATIVE
Comment: NORMAL
Neisseria Gonorrhea: NEGATIVE
Trichomonas: NEGATIVE

## 2020-12-30 ENCOUNTER — Other Ambulatory Visit: Payer: Self-pay

## 2020-12-30 ENCOUNTER — Ambulatory Visit (INDEPENDENT_AMBULATORY_CARE_PROVIDER_SITE_OTHER): Payer: 59 | Admitting: *Deleted

## 2020-12-30 DIAGNOSIS — Z308 Encounter for other contraceptive management: Secondary | ICD-10-CM

## 2020-12-30 MED ORDER — MEDROXYPROGESTERONE ACETATE 150 MG/ML IM SUSP
150.0000 mg | Freq: Once | INTRAMUSCULAR | Status: AC
  Administered 2020-12-30: 150 mg via INTRAMUSCULAR

## 2020-12-30 NOTE — Progress Notes (Signed)
   NURSE VISIT- INJECTION  SUBJECTIVE:  Julie [name] is a 25 y.o. G81P2002 female here for a Depo Provera for contraception/period management. She is a GYN patient.   OBJECTIVE:  There were no vitals taken for this visit.  Appears well, in no apparent distress  Injection administered in: Left upper quad. gluteus  Meds ordered this encounter  Medications  . medroxyPROGESTERone (DEPO-PROVERA) injection 150 mg    ASSESSMENT: GYN patient Depo Provera for contraception/period management PLAN: Follow-up: in 11-13 weeks for next Depo   [name]  12/30/2020 10:27 AM

## 2021-01-05 ENCOUNTER — Emergency Department (HOSPITAL_COMMUNITY)
Admission: EM | Admit: 2021-01-05 | Discharge: 2021-01-05 | Disposition: A | Discharge status: Home / Self Care | Payer: 59 | Attending: Emergency Medicine | Admitting: Emergency Medicine

## 2021-01-05 ENCOUNTER — Other Ambulatory Visit: Payer: Self-pay

## 2021-01-05 ENCOUNTER — Encounter (HOSPITAL_COMMUNITY): Payer: Self-pay | Admitting: Emergency Medicine

## 2021-01-05 DIAGNOSIS — R1084 Generalized abdominal pain: Secondary | ICD-10-CM | POA: Diagnosis not present

## 2021-01-05 DIAGNOSIS — R197 Diarrhea, unspecified: Secondary | ICD-10-CM | POA: Insufficient documentation

## 2021-01-05 DIAGNOSIS — R112 Nausea with vomiting, unspecified: Secondary | ICD-10-CM | POA: Insufficient documentation

## 2021-01-05 LAB — COMPREHENSIVE METABOLIC PANEL
ALT: 14 U/L (ref 0–44)
AST: 17 U/L (ref 15–41)
Albumin: 4.1 g/dL (ref 3.5–5.0)
Alkaline Phosphatase: 59 U/L (ref 38–126)
Anion gap: 9 (ref 5–15)
BUN: 12 mg/dL (ref 6–20)
CO2: 24 mmol/L (ref 22–32)
Calcium: 9.5 mg/dL (ref 8.9–10.3)
Chloride: 105 mmol/L (ref 98–111)
Creatinine, Ser: 0.83 mg/dL (ref 0.44–1.00)
GFR, Estimated: >60 mL/min (ref >60)
Glucose, Bld: 117 mg/dL — ABNORMAL HIGH (ref 70–99)
Potassium: 3.4 mmol/L — ABNORMAL LOW (ref 3.5–5.1)
Sodium: 138 mmol/L (ref 135–145)
Total Bilirubin: 0.6 mg/dL (ref 0.3–1.2)
Total Protein: 8.3 g/dL — ABNORMAL HIGH (ref 6.5–8.1)

## 2021-01-05 LAB — CBC WITH DIFFERENTIAL/PLATELET
Abs Immature Granulocytes: 0.03 10*3/uL (ref 0.00–0.07)
Basophils Absolute: 0 10*3/uL (ref 0.0–0.1)
Basophils Relative: 0 %
Eosinophils Absolute: 0.1 10*3/uL (ref 0.0–0.5)
Eosinophils Relative: 1 %
HCT: 39.5 % (ref 36.0–46.0)
Hemoglobin: 13 g/dL (ref 12.0–15.0)
Immature Granulocytes: 0 %
Lymphocytes Relative: 19 %
Lymphs Abs: 1.5 10*3/uL (ref 0.7–4.0)
MCH: 30 pg (ref 26.0–34.0)
MCHC: 32.9 g/dL (ref 30.0–36.0)
MCV: 91 fL (ref 80.0–100.0)
Monocytes Absolute: 0.4 10*3/uL (ref 0.1–1.0)
Monocytes Relative: 5 %
Neutro Abs: 5.8 10*3/uL (ref 1.7–7.7)
Neutrophils Relative %: 75 %
Platelets: 284 10*3/uL (ref 150–400)
RBC: 4.34 MIL/uL (ref 3.87–5.11)
RDW: 12.7 % (ref 11.5–15.5)
WBC: 7.8 10*3/uL (ref 4.0–10.5)
nRBC: 0 % (ref 0.0–0.2)

## 2021-01-05 LAB — LIPASE, BLOOD: Lipase: 27 U/L (ref 11–51)

## 2021-01-05 LAB — URINALYSIS, ROUTINE W REFLEX MICROSCOPIC
Bilirubin Urine: NEGATIVE
Glucose, UA: NEGATIVE mg/dL
Ketones, ur: 5 mg/dL — AB
Leukocytes,Ua: NEGATIVE
Nitrite: NEGATIVE
Protein, ur: NEGATIVE mg/dL
Specific Gravity, Urine: 1.023 (ref 1.005–1.030)
pH: 6 (ref 5.0–8.0)

## 2021-01-05 LAB — HCG, QUANTITATIVE, PREGNANCY: hCG, Beta Chain, Quant, S: 1 m[IU]/mL (ref <5)

## 2021-01-05 MED ORDER — SODIUM CHLORIDE 0.9 % IV BOLUS
1000.0000 mL | Freq: Once | INTRAVENOUS | Status: AC
  Administered 2021-01-05: 1000 mL via INTRAVENOUS

## 2021-01-05 MED ORDER — ONDANSETRON HCL 4 MG/2ML IJ SOLN
4.0000 mg | Freq: Once | INTRAMUSCULAR | Status: AC
  Administered 2021-01-05: 4 mg via INTRAVENOUS
  Filled 2021-01-05: qty 2

## 2021-01-05 MED ORDER — ONDANSETRON HCL 4 MG PO TABS: 4.0000 mg | ORAL_TABLET | Freq: Four times a day (QID) | ORAL | 0 refills | Status: DC

## 2021-01-05 NOTE — ED Notes (Signed)
Tolerating fluids well.  Pt reports feeling much better.

## 2021-01-05 NOTE — ED Provider Notes (Signed)
[facility] EMERGENCY DEPARTMENT Provider Note   CSN: [id number] Arrival date & time: 01/05/21  7564     History Chief Complaint  Patient presents with  . Emesis    Julie [name] is a 25 y.o. female.  HPI      Julie [name] is a 25 y.o. female who presents to the Emergency Department complaining of sudden onset of nausea, vomiting, and diarrhea starting at 4 AM this morning.  She states that she ate chicken steak and shrimp last night around 8 PM and believes that she may have food poisoning.  States that she felt fine when she went to bed.  Began with vomiting has had multiple episodes of vomiting and unable to tolerate any fluids this morning.  Describes having watery diarrhea as well and cramping of her abdomen.  Symptoms have been associated with some dizziness upon standing or with position change.  No abdominal pain prior to vomiting.  She denies recent illness, headache, fever, chills, dysuria chest pain or shortness of breath.    Past Medical History:  Diagnosis Date  . Medical history non-contributory     Patient Active Problem List   Diagnosis Date Noted  . Encounter for surveillance of injectable contraceptive 11/15/2020  . Encounter for well woman exam with routine gynecological exam 11/15/2020  . Surveillance for Depo-Provera contraception 10/29/2018  . Screening examination for STD (sexually transmitted disease) 10/29/2018  . Family planning 10/29/2018  . Encounter for gynecological examination with Papanicolaou smear of cervix 10/29/2018  . Marijuana use 04/09/2017  . 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 NO PAST SURGERIES       OB History    Gravida  2   Para  2   Term  2   Preterm      AB      Living  2     SAB      IAB      Ectopic      Multiple  0   Live Births  2           Family History  Problem Relation Age of Onset  . Diabetes Paternal Grandmother     Social History    Tobacco Use  . Smoking status: Never Smoker  . Smokeless tobacco: Never Used  Vaping Use  . Vaping Use: Never used  Substance Use Topics  . Alcohol use: No  . Drug use: No    Home Medications Prior to Admission medications   Medication Sig Start Date End Date Taking? Authorizing Provider  medroxyPROGESTERone (DEPO-PROVERA) 150 MG/ML injection Inject 1 mL (150 mg total) into the muscle every 3 (three) months. 11/15/20   Adline Potter, NP  megestrol (MEGACE) 40 MG tablet TAKE 3 TABS X5DAYS, 2 TABS X5DAYS, THEN 1 DAILY UNTIL VAGINAL BLEEDING STOPS 09/01/20   Adline Potter, NP  Pediatric Multiple Vit-C-FA (FLINSTONES GUMMIES OMEGA-3 DHA PO) Take 2 each by mouth daily.     [provider]    Allergies    Bee venom  Review of Systems   Review of Systems  Constitutional: Negative for appetite change, chills and fever.  HENT: Negative for sore throat and trouble swallowing.   Respiratory: Negative for shortness of breath.   Cardiovascular: Negative for chest pain.  Gastrointestinal: Positive for abdominal pain (abdominal cramping), diarrhea, nausea and vomiting. Negative for blood in stool.  Genitourinary: Negative for decreased urine volume, difficulty urinating, dysuria and flank  pain.  Musculoskeletal: Negative for arthralgias, back pain and myalgias.  Skin: Negative for color change and rash.  Neurological: Positive for dizziness. Negative for syncope, weakness and numbness.  Hematological: Negative for adenopathy.  Psychiatric/Behavioral: Negative for confusion.    Physical Exam Updated Vital Signs BP 134/71   Pulse 89   Temp (!) 97.5 F (36.4 C) (Oral)   Resp 16   Ht 5\' 5"  (1.651 m)   Wt 69.4 kg   SpO2 100%   BMI 25.46 kg/m   Physical Exam Vitals and nursing note reviewed.  Constitutional:      General: She is not in acute distress.    Appearance: She is not ill-appearing or toxic-appearing.  HENT:     Head: Normocephalic.      Mouth/Throat:     Mouth: Mucous membranes are moist.  Eyes:     Pupils: Pupils are equal, round, and reactive to light.  Neck:     Thyroid: No thyromegaly.     Meningeal: Kernig's sign absent.  Cardiovascular:     Rate and Rhythm: Normal rate and regular rhythm.     Pulses: Normal pulses.  Pulmonary:     Effort: Pulmonary effort is normal.     Breath sounds: Normal breath sounds. No wheezing.  Abdominal:     Palpations: Abdomen is soft.     Tenderness: There is abdominal tenderness (mild generalized, ttp of the entire abomen.  no guardign or rebound tenderness. ). There is no guarding or rebound.  Musculoskeletal:        General: No tenderness. Normal range of motion.     Cervical back: Normal range of motion.  Skin:    General: Skin is warm.     Capillary Refill: Capillary refill takes less than 2 seconds.     Findings: No rash.  Neurological:     General: No focal deficit present.     Mental Status: She is alert and oriented to person, place, and time.     Sensory: No sensory deficit.     Motor: No weakness.     ED Results / Procedures / Treatments   Labs (all labs ordered are listed, but only abnormal results are displayed) Labs Reviewed - No data to display  EKG None  Radiology No results found.  Procedures Procedures   Medications Ordered in ED Medications  sodium chloride 0.9 % bolus 1,000 mL (has no administration in time range)  ondansetron (ZOFRAN) injection 4 mg (has no administration in time range)    ED Course  I have reviewed the triage vital signs and the nursing notes.  Pertinent labs & imaging results that were available during my care of the patient were reviewed by me and considered in my medical decision making (see chart for details).    MDM Rules/Calculators/A&P                         Patient here with sudden onset nausea vomiting diarrhea after eating food last evening.  Multiple episodes of vomiting and watery brown stools.  On exam,  abdomen is soft no peritoneal signs.  Likely viral illness.  No clinical signs of dehydration or sepsis.  Will give IV fluids, antiemetic and check labs.  1055 on recheck, patient resting comfortably.  No further vomiting or diarrhea.  States that she feels much better.  We will try oral fluid challenge.  1135 on recheck, patient has tolerated oral fluid challenge without difficulty.  States she  is feeling much better and ready for discharge home.  Recheck of the abdomen is now soft and nontender.  Low clinical suspicion for appendicitis or emergent process.  Likely viral.  Discussed return precautions, patient verbalized understanding agrees to plan.  Final Clinical Impression(s) / ED Diagnoses Final diagnoses:  Nausea vomiting and diarrhea    Rx / DC Orders ED Discharge Orders    None       Rosey Bath 01/07/21 2201    Bethann Berkshire, MD 01/10/21 [phone]

## 2021-01-05 NOTE — ED Triage Notes (Signed)
Pt reports possibly having food poisoning from dinner last night.  Ate loaded baked potato with chicken, steak and shrimp.  Vomiting stated this am, vomited about 10 times.

## 2021-01-05 NOTE — Discharge Instructions (Signed)
Your blood work and urine test today was reassuring.  I recommend that you take frequent, small sips of clear fluids today.  Bland diet as tolerated starting this evening.  You may also take over-the-counter Imodium if the diarrhea is not improving.  Follow-up with your primary doctor for recheck.  Return to the emergency department for any new or worsening symptoms.

## 2021-01-06 ENCOUNTER — Ambulatory Visit: Payer: 59 | Admitting: Internal Medicine

## 2021-01-06 ENCOUNTER — Emergency Department (HOSPITAL_COMMUNITY)
Admission: EM | Admit: 2021-01-06 | Discharge: 2021-01-06 | Disposition: A | Discharge status: Home / Self Care | Payer: 59 | Attending: Emergency Medicine | Admitting: Emergency Medicine

## 2021-01-06 ENCOUNTER — Other Ambulatory Visit: Payer: Self-pay

## 2021-01-06 ENCOUNTER — Emergency Department (HOSPITAL_COMMUNITY): Payer: 59

## 2021-01-06 ENCOUNTER — Encounter (HOSPITAL_COMMUNITY): Payer: Self-pay | Admitting: *Deleted

## 2021-01-06 DIAGNOSIS — R112 Nausea with vomiting, unspecified: Secondary | ICD-10-CM | POA: Insufficient documentation

## 2021-01-06 DIAGNOSIS — R42 Dizziness and giddiness: Secondary | ICD-10-CM | POA: Insufficient documentation

## 2021-01-06 DIAGNOSIS — R197 Diarrhea, unspecified: Secondary | ICD-10-CM | POA: Diagnosis not present

## 2021-01-06 LAB — CBC WITH DIFFERENTIAL/PLATELET
Abs Immature Granulocytes: 0.02 10*3/uL (ref 0.00–0.07)
Basophils Absolute: 0 10*3/uL (ref 0.0–0.1)
Basophils Relative: 0 %
Eosinophils Absolute: 0 10*3/uL (ref 0.0–0.5)
Eosinophils Relative: 0 %
HCT: 40.9 % (ref 36.0–46.0)
Hemoglobin: 13.7 g/dL (ref 12.0–15.0)
Immature Granulocytes: 0 %
Lymphocytes Relative: 24 %
Lymphs Abs: 1.9 10*3/uL (ref 0.7–4.0)
MCH: 30.3 pg (ref 26.0–34.0)
MCHC: 33.5 g/dL (ref 30.0–36.0)
MCV: 90.5 fL (ref 80.0–100.0)
Monocytes Absolute: 0.5 10*3/uL (ref 0.1–1.0)
Monocytes Relative: 7 %
Neutro Abs: 5.5 10*3/uL (ref 1.7–7.7)
Neutrophils Relative %: 69 %
Platelets: 286 10*3/uL (ref 150–400)
RBC: 4.52 MIL/uL (ref 3.87–5.11)
RDW: 12.7 % (ref 11.5–15.5)
WBC: 8 10*3/uL (ref 4.0–10.5)
nRBC: 0 % (ref 0.0–0.2)

## 2021-01-06 LAB — COMPREHENSIVE METABOLIC PANEL
ALT: 17 U/L (ref 0–44)
AST: 18 U/L (ref 15–41)
Albumin: 3.9 g/dL (ref 3.5–5.0)
Alkaline Phosphatase: 55 U/L (ref 38–126)
Anion gap: 8 (ref 5–15)
BUN: 10 mg/dL (ref 6–20)
CO2: 24 mmol/L (ref 22–32)
Calcium: 9.3 mg/dL (ref 8.9–10.3)
Chloride: 107 mmol/L (ref 98–111)
Creatinine, Ser: 0.86 mg/dL (ref 0.44–1.00)
GFR, Estimated: >60 mL/min (ref >60)
Glucose, Bld: 90 mg/dL (ref 70–99)
Potassium: 3.4 mmol/L — ABNORMAL LOW (ref 3.5–5.1)
Sodium: 139 mmol/L (ref 135–145)
Total Bilirubin: 0.7 mg/dL (ref 0.3–1.2)
Total Protein: 7.9 g/dL (ref 6.5–8.1)

## 2021-01-06 MED ORDER — SODIUM CHLORIDE 0.9 % IV BOLUS
1000.0000 mL | Freq: Once | INTRAVENOUS | Status: AC
  Administered 2021-01-06: 1000 mL via INTRAVENOUS

## 2021-01-06 MED ORDER — MECLIZINE HCL 12.5 MG PO TABS
25.0000 mg | ORAL_TABLET | Freq: Once | ORAL | Status: AC
  Administered 2021-01-06: 25 mg via ORAL
  Filled 2021-01-06: qty 2

## 2021-01-06 MED ORDER — MECLIZINE HCL 12.5 MG PO TABS: 12.5000 mg | ORAL_TABLET | Freq: Three times a day (TID) | ORAL | 0 refills | Status: DC | PRN

## 2021-01-06 NOTE — ED Triage Notes (Signed)
Nausea and dizziness, seen yesterday for same

## 2021-01-06 NOTE — ED Provider Notes (Signed)
[facility] EMERGENCY DEPARTMENT Provider Note   CSN: [id number] Arrival date & time: 01/06/21  1303     History Chief Complaint  Patient presents with  . Vomiting    Julie [name] is a 25 y.o. female.  HPI Patient is a 25 year old female who presents the emergency department for nausea, vomiting, and diarrhea that started yesterday morning.  She was seen in the emergency department yesterday and had a reassuring work-up.  She was given IV fluids as well as IV Zofran, p.o. challenge, and discharged in stable condition.  She states that she has continued to take p.o. Zofran but due to her significant nausea/vomiting she has not been able to tolerate this medication orally and has had continued symptoms.  She states this morning she woke up with additional dizziness.  States the dizziness worsens with any movement. Describes it as "the room is spinning".  Due to this, she was having difficulty with ambulation.  Because of her worsening symptoms she came to the emergency department for reevaluation.  No ear pain or ear discharge.  Denies any numbness, weakness, or headaches.  No chest pain, shortness of breath, or abdominal pain.  No urinary complaints.  No vaginal discharge.    Past Medical History:  Diagnosis Date  . Medical history non-contributory     Patient Active Problem List   Diagnosis Date Noted  . Encounter for surveillance of injectable contraceptive 11/15/2020  . Encounter for well woman exam with routine gynecological exam 11/15/2020  . Surveillance for Depo-Provera contraception 10/29/2018  . Screening examination for STD (sexually transmitted disease) 10/29/2018  . Family planning 10/29/2018  . Encounter for gynecological examination with Papanicolaou smear of cervix 10/29/2018  . Marijuana use 04/09/2017  . 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 NO PAST SURGERIES       OB History    Gravida  2   Para  2   Term  2    Preterm      AB      Living  2     SAB      IAB      Ectopic      Multiple  0   Live Births  2           Family History  Problem Relation Age of Onset  . Diabetes Paternal Grandmother     Social History   Tobacco Use  . Smoking status: Never Smoker  . Smokeless tobacco: Never Used  Vaping Use  . Vaping Use: Never used  Substance Use Topics  . Alcohol use: No  . Drug use: No    Home Medications Prior to Admission medications   Medication Sig Start Date End Date Taking? Authorizing Provider  meclizine (ANTIVERT) 12.5 MG tablet Take 1 tablet (12.5 mg total) by mouth 3 (three) times daily as needed for dizziness. 01/06/21  Yes Placido Sou, PA-C  medroxyPROGESTERone (DEPO-PROVERA) 150 MG/ML injection Inject 1 mL (150 mg total) into the muscle every 3 (three) months. 11/15/20   Adline Potter, NP  megestrol (MEGACE) 40 MG tablet TAKE 3 TABS X5DAYS, 2 TABS X5DAYS, THEN 1 DAILY UNTIL VAGINAL BLEEDING STOPS Patient taking differently: Take 40 mg by mouth as needed (bleeding). 09/01/20   Adline Potter, NP  ondansetron (ZOFRAN) 4 MG tablet Take 1 tablet (4 mg total) by mouth every 6 (six) hours. As needed for nausea and vomiting 01/05/21   Pauline Aus, PA-C  Pediatric  Multiple Vit-C-FA (FLINSTONES GUMMIES OMEGA-3 DHA PO) Take 2 each by mouth daily.     [provider]    Allergies    Bee venom  Review of Systems   Review of Systems  All other systems reviewed and are negative. Ten systems reviewed and are negative for acute change, except as noted in the HPI.   Physical Exam Updated Vital Signs BP 131/82   Pulse 69   Temp 98.3 F (36.8 C) (Oral)   Resp 18   Ht 5\' 5"  (1.651 m)   Wt 70.3 kg   SpO2 100%   BMI 25.79 kg/m   Physical Exam Vitals and nursing note reviewed.  Constitutional:      General: She is not in acute distress.    Appearance: Normal appearance. She is not ill-appearing, toxic-appearing or diaphoretic.  HENT:      Head: Normocephalic and atraumatic.     Right Ear: Tympanic membrane, ear canal and external ear normal. There is no impacted cerumen.     Left Ear: Tympanic membrane, ear canal and external ear normal. There is no impacted cerumen.     Ears:     Comments: Bilateral ears, EACs, and TMs all appear normal.    Nose: Nose normal.     Mouth/Throat:     Mouth: Mucous membranes are moist.     Pharynx: Oropharynx is clear. No oropharyngeal exudate or posterior oropharyngeal erythema.  Eyes:     Extraocular Movements: Extraocular movements intact.  Cardiovascular:     Rate and Rhythm: Normal rate and regular rhythm.     Pulses: Normal pulses.     Heart sounds: Normal heart sounds. No murmur heard. No friction rub. No gallop.   Pulmonary:     Effort: Pulmonary effort is normal. No respiratory distress.     Breath sounds: Normal breath sounds. No stridor. No wheezing, rhonchi or rales.  Abdominal:     General: Abdomen is flat.     Palpations: Abdomen is soft.     Tenderness: There is no abdominal tenderness.     Comments: Abdomen is flat, soft, and nontender in all 4 quadrants.  Musculoskeletal:        General: Normal range of motion.     Cervical back: Normal range of motion and neck supple. No tenderness.     Right lower leg: No edema.     Left lower leg: No edema.  Skin:    General: Skin is warm and dry.  Neurological:     General: No focal deficit present.     Mental Status: She is alert and oriented to person, place, and time.     Comments: Patient is oriented to person, place, and time. Patient phonates in clear, complete, and coherent sentences. Strength is 5/5 in all four extremities. Distal sensation intact in all four extremities.  Psychiatric:        Mood and Affect: Mood normal.        Behavior: Behavior normal.    ED Results / Procedures / Treatments   Labs (all labs ordered are listed, but only abnormal results are displayed) Labs Reviewed  COMPREHENSIVE METABOLIC PANEL  - Abnormal; Notable for the following components:      Result Value   Potassium 3.4 (*)    All other components within normal limits  CBC WITH DIFFERENTIAL/PLATELET  URINALYSIS, ROUTINE W REFLEX MICROSCOPIC   EKG None  Radiology CT Head Wo Contrast  Result Date: 01/06/2021 CLINICAL DATA:  Dizziness, nonspecific. Additional  history provided: Patient reports fall x 2 today, nausea and dizziness. EXAM: CT HEAD WITHOUT CONTRAST TECHNIQUE: Contiguous axial images were obtained from the base of the skull through the vertex without intravenous contrast. COMPARISON:  No pertinent prior exams available for comparison. FINDINGS: Brain: Cerebral volume is normal. Apparent subcentimeter focus of hypodensity within the left cerebellar hemisphere (series 2, image 13) (series 4, image 55). There is no acute intracranial hemorrhage. No demarcated cortical infarct. No extra-axial fluid collection. No evidence of intracranial mass. No midline shift. Vascular: No hyperdense vessel. Skull: Normal. Negative for fracture or focal lesion. Sinuses/Orbits: Visualized orbits show no acute finding. No significant paranasal sinus disease at the imaged levels. Other: Nonspecific prominence of the partially imaged posterior nasopharyngeal soft tissues. IMPRESSION: Apparent subcentimeter focus of hypodensity within the left cerebellar hemisphere. This could be artifactual. However, given the provided history, consider brain MRI to exclude an acute lacunar infarct at this site. Otherwise unremarkable non-contrast CT appearance of the brain. Nonspecific prominence of the partially imaged posterior nasopharyngeal soft tissues. Correlate clinically. Dedicated imaging may be obtained, as warranted. Electronically Signed   By: Jackey Loge DO   On: 01/06/2021 14:57   MR BRAIN WO CONTRAST  Result Date: 01/06/2021 CLINICAL DATA:  Dizziness, possible small infarct on CT EXAM: MRI HEAD WITHOUT CONTRAST TECHNIQUE: Multiplanar, multiecho pulse  sequences of the brain and surrounding structures were obtained without intravenous contrast. COMPARISON:  None. FINDINGS: Brain: There is no acute infarction or intracranial hemorrhage. There is no intracranial mass, mass effect, or edema. There is no hydrocephalus or extra-axial fluid collection. Ventricles and sulci are normal in size and configuration. Vascular: Major vessel flow voids at the skull base are preserved. Skull and upper cervical spine: Normal marrow signal is preserved. Sinuses/Orbits: Paranasal sinuses are aerated. Orbits are unremarkable. Other: Sella is unremarkable.  Mastoid air cells are clear. IMPRESSION: Normal study.  No acute infarction, hemorrhage, or mass. Electronically Signed   By: Guadlupe Spanish M.D.   On: 01/06/2021 16:04    Procedures Procedures   Medications Ordered in ED Medications  sodium chloride 0.9 % bolus 1,000 mL (0 mLs Intravenous Stopped 01/06/21 1649)  meclizine (ANTIVERT) tablet 25 mg (25 mg Oral Given 01/06/21 1403)   ED Course  I have reviewed the triage vital signs and the nursing notes.  Pertinent labs & imaging results that were available during my care of the patient were reviewed by me and considered in my medical decision making (see chart for details).    MDM Rules/Calculators/A&P                          Pt is a 25 y.o. female who presents the emergency department due to nausea, vomiting, diarrhea, as well as dizziness.  Labs: Patient evaluated in the emergency department yesterday with similar complaints.  Basic lab work was all reassuring, patient was p.o. challenged, and she was discharged in stable condition. CBC and CMP today are reassuring.  Potassium of 3.4, otherwise lab work is within normal limits.  Imaging: CT scan of the head shows an apparent subcentimeter focus of hypodensity within the left cerebellar hemisphere.  They state that this could be artifactual.  Due to her sx today, they recommended MRI of the brain. MRI of  the brain was obtained which was negative for infarction, hemorrhage, or mass.  No abnormalities.  I, Placido Sou, PA-C, personally reviewed and evaluated these images and lab results as part of my  medical decision-making.  Patient experiencing nausea, vomiting, as well as diarrhea.  This morning she started also experiencing dizziness.  Symptoms today seem to be exacerbated by her dizziness.  Physical exam consistent with a peripheral vertigo.  She becomes more vertiginous with any movement of the head as well as ambulation.  Obtain a CT scan of the head which was concerning for a subcentimeter area of hypodensity within the left cerebellar hemisphere.  I follow this up with an MRI of the brain which was negative.  Patient p.o. challenged successfully.  She was also ambulated throughout the emergency department and notes a significant improvement of her symptoms after being given IV meclizine.  Feel the patient is stable for discharge at this time and she is agreeable.  Will discharge on a course of p.o. meclizine.  We discussed safety regarding this medication.  She already has a prescription for Zofran at home for breakthrough nausea and vomiting.  Discussed return precautions at length.  Her questions were answered and she was amicable at the time of discharge.  Note: Portions of this report may have been transcribed using voice recognition software. Every effort was made to ensure accuracy; however, inadvertent computerized transcription errors may be present.   Final Clinical Impression(s) / ED Diagnoses Final diagnoses:  Nausea vomiting and diarrhea  Dizziness   Rx / DC Orders ED Discharge Orders         Ordered    meclizine (ANTIVERT) 12.5 MG tablet  3 times daily PRN        01/06/21 1800           Placido Sou, PA-C 01/06/21 1805    Bethann Berkshire, MD 01/10/21 [phone]

## 2021-01-06 NOTE — ED Notes (Signed)
Patient transported to CT 

## 2021-01-06 NOTE — Discharge Instructions (Addendum)
Like we discussed, I am prescribing a medication called meclizine.  This is the same medication that I gave you in the emergency department for dizziness.  You can take this up to 3 times a day.  Only take this as prescribed.  Do not mix with alcohol.  Do not operate a motor vehicle after taking it.  This medication can be sedating.  Please continue to monitor your symptoms.  I would recommend eating bland foods such as bananas and rice.  No spicy foods or dairy until your symptoms resolve.  If your symptoms worsen, please return to the emergency department.  It was a pleasure to meet you.

## 2021-01-07 ENCOUNTER — Telehealth: Payer: Self-pay | Admitting: *Deleted

## 2021-01-07 NOTE — Telephone Encounter (Signed)
Transition Care Management Follow-up Telephone Call  Date of discharge and from where: 01/06/2021 - [facility]  How have you been since you were released from the hospital? "Fine"  Any questions or concerns? No  Items Reviewed:  Did the pt receive and understand the discharge instructions provided? Yes   Medications obtained and verified? Yes   Other? No   Any new allergies since your discharge? No   Dietary orders reviewed? No  Do you have support at home? Yes    Functional Questionnaire: (I = Independent and D = Dependent) ADLs: I  Bathing/Dressing- I  Meal Prep- I  Eating- I  Maintaining continence- I  Transferring/Ambulation- I  Managing Meds- I  Follow up appointments reviewed:   PCP Hospital f/u appt confirmed? No    Specialist Hospital f/u appt confirmed? No    Are transportation arrangements needed? N/A  If their condition worsens, is the pt aware to call PCP or go to the Emergency Dept.? Yes  Was the patient provided with contact information for the PCP's office or ED? Yes  Was to pt encouraged to call back with questions or concerns? Yes

## 2021-01-30 ENCOUNTER — Other Ambulatory Visit: Payer: Self-pay | Admitting: Adult Health

## 2021-03-17 ENCOUNTER — Ambulatory Visit: Payer: 59

## 2021-03-25 ENCOUNTER — Ambulatory Visit (INDEPENDENT_AMBULATORY_CARE_PROVIDER_SITE_OTHER): Payer: 59

## 2021-03-25 ENCOUNTER — Other Ambulatory Visit: Payer: Self-pay

## 2021-03-25 DIAGNOSIS — Z3042 Encounter for surveillance of injectable contraceptive: Secondary | ICD-10-CM

## 2021-03-25 MED ORDER — MEDROXYPROGESTERONE ACETATE 150 MG/ML IM SUSP
150.0000 mg | Freq: Once | INTRAMUSCULAR | Status: AC
  Administered 2021-03-25: 150 mg via INTRAMUSCULAR

## 2021-03-25 NOTE — Progress Notes (Signed)
   NURSE VISIT- INJECTION  SUBJECTIVE:  Julie [name] is Julie 25 y.o. [id number] female here for Julie Depo Provera for contraception/period management. She is [facility] patient.   OBJECTIVE:  There were no vitals taken for this visit.  Appears well, in no apparent distress  Injection administered in: Right upper quad. gluteus  Meds ordered this encounter  Medications   medroxyPROGESTERone (DEPO-PROVERA) injection 150 mg    ASSESSMENT: GYN patient Depo Provera for contraception/period management PLAN: Follow-up: in 11-13 weeks for next Depo   Julie [name] Julie [name]  03/25/2021 10:20 AM

## 2021-03-27 ENCOUNTER — Other Ambulatory Visit: Payer: Self-pay | Admitting: Adult Health

## 2021-04-26 ENCOUNTER — Other Ambulatory Visit: Payer: Self-pay

## 2021-04-26 ENCOUNTER — Emergency Department (HOSPITAL_COMMUNITY)
Admission: EM | Admit: 2021-04-26 | Discharge: 2021-04-26 | Disposition: A | Discharge status: Home / Self Care | Payer: 59 | Attending: Emergency Medicine | Admitting: Emergency Medicine

## 2021-04-26 ENCOUNTER — Encounter (HOSPITAL_COMMUNITY): Payer: Self-pay | Admitting: Emergency Medicine

## 2021-04-26 ENCOUNTER — Telehealth: Payer: Self-pay

## 2021-04-26 DIAGNOSIS — U071 COVID-19: Secondary | ICD-10-CM | POA: Diagnosis not present

## 2021-04-26 DIAGNOSIS — R519 Headache, unspecified: Secondary | ICD-10-CM | POA: Diagnosis present

## 2021-04-26 DIAGNOSIS — B349 Viral infection, unspecified: Secondary | ICD-10-CM

## 2021-04-26 LAB — SARS CORONAVIRUS 2 (TAT 6-24 HRS): SARS Coronavirus 2: POSITIVE — AB

## 2021-04-26 NOTE — ED Notes (Signed)
Discharge instructions discussed with pt. Pt informed pending COVID test and access to my Chart. If COVID+ instructed on quarantine. Pt verbalized understanding with no questions at this time.

## 2021-04-26 NOTE — ED Triage Notes (Signed)
Pt state she has not felt good since Sunday. Pt c/o headache and vomited once. Pt states she took a at home covid test that was positive this am.

## 2021-04-26 NOTE — ED Provider Notes (Signed)
[facility] EMERGENCY DEPARTMENT Provider Note   CSN: [id number] Arrival date & time: 04/26/21  0447     History Chief Complaint  Patient presents with   Headache    Julie [name] is a 25 y.o. female.  Patient is a 25 year old female with no significant past medical history.  She presents with a 2-day history of body aches, headache, feeling tired and weak.  She took a home COVID test which was positive, but is skeptical of the results.  She is requesting testing here.  She denies chest pain or difficulty breathing.  The history is provided by the patient.      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 Authorizing Provider  meclizine (ANTIVERT) 12.5 MG tablet Take 1 tablet (12.5 mg total) by mouth 3 (three) times daily as needed for  dizziness. 01/06/21   Placido Sou, PA-C  medroxyPROGESTERone (DEPO-PROVERA) 150 MG/ML injection ADMINISTER 1 ML(150 MG) IN THE MUSCLE EVERY 3 MONTHS 03/28/21   Adline Potter, NP  megestrol (MEGACE) 40 MG tablet Take 1 tablet (40 mg total) by mouth as needed (bleeding). 02/01/21   Adline Potter, NP  ondansetron (ZOFRAN) 4 MG tablet Take 1 tablet (4 mg total) by mouth every 6 (six) hours. As needed for nausea and vomiting 01/05/21   Pauline Aus, PA-C  Pediatric Multiple Vit-C-FA (FLINSTONES GUMMIES OMEGA-3 DHA PO) Take 2 each by mouth daily.     [provider]    Allergies    Bee venom  Review of Systems   Review of Systems  All other systems reviewed and are negative.  Physical Exam Updated Vital Signs BP 136/80   Pulse 95   Temp 98.3 F (36.8 C) (Oral)   Resp 17   Ht 5\' 5"  (1.651 m)   Wt 73.9 kg   SpO2 99%   BMI 27.12 kg/m   Physical Exam Vitals and nursing note reviewed.  Constitutional:      General: She is not in acute distress.  Appearance: She is well-developed. She is not diaphoretic.  HENT:     Head: Normocephalic and atraumatic.  Cardiovascular:     Rate and Rhythm: Normal rate and regular rhythm.     Heart sounds: No murmur heard.   No friction rub. No gallop.  Pulmonary:     Effort: Pulmonary effort is normal. No respiratory distress.     Breath sounds: Normal breath sounds. No wheezing.  Abdominal:     General: Bowel sounds are normal. There is no distension.     Palpations: Abdomen is soft.     Tenderness: There is no abdominal tenderness.  Musculoskeletal:        General: Normal range of motion.     Cervical back: Normal range of motion and neck supple.  Skin:    General: Skin is warm and dry.  Neurological:     General: No focal deficit present.     Mental Status: She is alert and oriented to person, place, and time.    ED Results / Procedures / Treatments   Labs (all labs ordered are listed, but only abnormal results  are displayed) Labs Reviewed - No data to display  EKG None  Radiology No results found.  Procedures Procedures   Medications Ordered in ED Medications - No data to display  ED Course  I have reviewed the triage vital signs and the nursing notes.  Pertinent labs & imaging results that were available during my care of the patient were reviewed by me and considered in my medical decision making (see chart for details).    MDM Rules/Calculators/A&P  Patient desires confirmatory testing for COVID after having a positive home test.  We will retest her here, but discharge seems appropriate with as needed return.  Test results pending on a 6 to 24-hour COVID test.  Final Clinical Impression(s) / ED Diagnoses Final diagnoses:  None    Rx / DC Orders ED Discharge Orders     None        Geoffery Lyons, MD 04/26/21 [phone]

## 2021-04-26 NOTE — Discharge Instructions (Addendum)
Drink plenty of fluids and get plenty of rest.  Take Tylenol 1000 mg rotated with ibuprofen 600 mg every 4 hours as needed for pain or fever.  If your COVID test returns positive (this should be later this afternoon), be sure to isolate at home for the next 5 days and wear a mask in public for an additional 5 days.  Return to the emergency department if you develop chest pain, difficulty breathing, or other new and concerning symptoms.

## 2021-04-26 NOTE — Telephone Encounter (Addendum)
Transition Care Management Unsuccessful Follow-up Telephone Call  Date of discharge and from where:  04/26/2021-[place]   Attempts:  1st Attempt  Reason for unsuccessful TCM follow-up call:  Left voice message

## 2021-04-27 NOTE — Telephone Encounter (Signed)
Transition Care Management Unsuccessful Follow-up Telephone Call  Date of discharge and from where:  04/26/2021-[place]   Attempts:  2nd Attempt  Reason for unsuccessful TCM follow-up call:  Left voice message

## 2021-04-28 ENCOUNTER — Telehealth: Payer: 59 | Admitting: Physician Assistant

## 2021-04-28 ENCOUNTER — Encounter (INDEPENDENT_AMBULATORY_CARE_PROVIDER_SITE_OTHER): Payer: Self-pay

## 2021-04-28 ENCOUNTER — Telehealth: Payer: Self-pay

## 2021-04-28 DIAGNOSIS — U071 COVID-19: Secondary | ICD-10-CM

## 2021-04-28 MED ORDER — ONDANSETRON 4 MG PO TBDP
4.0000 mg | ORAL_TABLET | Freq: Three times a day (TID) | ORAL | 0 refills | Status: DC | PRN
Start: 2021-04-28 — End: 2022-03-01

## 2021-04-28 MED ORDER — BENZONATATE 100 MG PO CAPS: 100.0000 mg | ORAL_CAPSULE | Freq: Three times a day (TID) | ORAL | 0 refills | Status: DC | PRN

## 2021-04-28 NOTE — Telephone Encounter (Signed)
Patient states that she has diarrhea and has had 3 episodes this morning. Patient states that she hasn't took any medicine because she can't hold it down. Patient has ben drinking fluids. Patient was advise on symptoms per protocol below.   If appetite becomes worse: encourage patient to drink fluids as tolerated, work their way up to bland solid food such as crackers, pretzels, soup, bread or applesauce and boiled starches.   If patient is unable to tolerate any foods or liquids, notify PCP.   IF PATIENT DEVELOPS SEVERE VOMITING (MORE THAN 6 TIMES A DAY AND OR >8 HOURS) AND/OR SEVERE ABDOMINAL PAIN ADVISE PATIENT TO CALL 911 AND SEEK TREATMENT IN ED   If diarrhea remains the same: encourage patient to drink oral fluids and bland foods.   Avoid alcohol, spicy foods, caffeine or fatty foods that could make diarrhea worse.   Continue to monitor for signs of dehydration (increased thirst decreased urine output, yellow urine, dry skin, headache or dizziness).   Advise patient to try OTC medication (Imodium, kaopectate, Pepto-Bismol) as per manufacturer's instructions.   If worsening diarrhea occurs and becomes severe (6-7 bowel movements a day): notify PCP   If diarrhea last greater than 7 days: notify PCP   IF SIGNS OF DEHYDRATION OCCUR (INCREASED THIRST, DECREASED URINE OUTPUT, YELLOW URINE, DRY SKIN, HEADACHE OR DIZZINESS) ADVISE PATIENT TO CALL 911 AND SEEK TREATMENT IN THE ED

## 2021-04-28 NOTE — Progress Notes (Signed)

## 2021-04-28 NOTE — Progress Notes (Signed)
I have spent 5 minutes in review of e-visit questionnaire, review and updating patient chart, medical decision making and response to patient.   Nael Petrosyan Cody Mannat Benedetti, PA-C    

## 2021-04-29 NOTE — Telephone Encounter (Signed)
Transition Care Management Follow-up Telephone Call Date of discharge and from where: 04/26/2021 from [facility] How have you been since you were released from the hospital? Pt stated that she is feeling better and did not have any questions or concerns at this time.  Any questions or concerns? No  Items Reviewed: Did the pt receive and understand the discharge instructions provided? Yes  Medications obtained and verified? Yes  Other? No  Any new allergies since your discharge? No  Dietary orders reviewed? No Do you have support at home? Yes   Functional Questionnaire: (I = Independent and D = Dependent) ADLs: I  Bathing/Dressing- I  Meal Prep- I  Eating- I  Maintaining continence- I  Transferring/Ambulation- I  Managing Meds- I   Follow up appointments reviewed:  PCP Hospital f/u appt confirmed? No   Specialist Hospital f/u appt confirmed? No   Are transportation arrangements needed? No  If their condition worsens, is the pt aware to call PCP or go to the Emergency Dept.? Yes Was the patient provided with contact information for the PCP's office or ED? Yes Was to pt encouraged to call back with questions or concerns? Yes

## 2021-06-15 ENCOUNTER — Ambulatory Visit (INDEPENDENT_AMBULATORY_CARE_PROVIDER_SITE_OTHER): Payer: 59 | Admitting: *Deleted

## 2021-06-15 ENCOUNTER — Other Ambulatory Visit: Payer: Self-pay

## 2021-06-15 DIAGNOSIS — Z3042 Encounter for surveillance of injectable contraceptive: Secondary | ICD-10-CM | POA: Diagnosis not present

## 2021-06-15 MED ORDER — MEDROXYPROGESTERONE ACETATE 150 MG/ML IM SUSP
150.0000 mg | Freq: Once | INTRAMUSCULAR | Status: AC
  Administered 2021-06-15: 150 mg via INTRAMUSCULAR

## 2021-06-15 NOTE — Progress Notes (Signed)
   NURSE VISIT- INJECTION  SUBJECTIVE:  Julie [name] is a 25 y.o. G35P2002 female here for a Depo Provera for contraception/period management. She is a GYN patient.   OBJECTIVE:  There were no vitals taken for this visit.  Appears well, in no apparent distress  Injection administered in: Left upper quad. gluteus  Meds ordered this encounter  Medications   medroxyPROGESTERone (DEPO-PROVERA) injection 150 mg    ASSESSMENT: GYN patient Depo Provera for contraception/period management PLAN: Follow-up: in 11-13 weeks for next Depo   Annamarie Dawley  06/15/2021 10:37 AM

## 2021-09-01 ENCOUNTER — Ambulatory Visit (INDEPENDENT_AMBULATORY_CARE_PROVIDER_SITE_OTHER): Payer: 59 | Admitting: *Deleted

## 2021-09-01 ENCOUNTER — Other Ambulatory Visit: Payer: Self-pay

## 2021-09-01 DIAGNOSIS — Z3042 Encounter for surveillance of injectable contraceptive: Secondary | ICD-10-CM | POA: Diagnosis not present

## 2021-09-01 MED ORDER — MEDROXYPROGESTERONE ACETATE 150 MG/ML IM SUSP
150.0000 mg | Freq: Once | INTRAMUSCULAR | Status: AC
  Administered 2021-09-01: 10:00:00 150 mg via INTRAMUSCULAR

## 2021-09-01 NOTE — Progress Notes (Signed)
° °  NURSE VISIT- INJECTION  SUBJECTIVE:  Julie [name] is a 25 y.o. G72P2002 female here for a Depo Provera for contraception/period management. She is a GYN patient.   OBJECTIVE:  There were no vitals taken for this visit.  Appears well, in no apparent distress  Injection administered in: Right upper quad. gluteus  Meds ordered this encounter  Medications   medroxyPROGESTERone (DEPO-PROVERA) injection 150 mg    ASSESSMENT: GYN patient Depo Provera for contraception/period management PLAN: Follow-up: in 11-13 weeks for next Depo   Annamarie Dawley  09/01/2021 9:40 AM

## 2021-11-22 ENCOUNTER — Other Ambulatory Visit: Payer: Self-pay

## 2021-11-22 ENCOUNTER — Ambulatory Visit (INDEPENDENT_AMBULATORY_CARE_PROVIDER_SITE_OTHER): Payer: Medicaid Other | Admitting: *Deleted

## 2021-11-22 DIAGNOSIS — Z3042 Encounter for surveillance of injectable contraceptive: Secondary | ICD-10-CM

## 2021-11-22 MED ORDER — MEDROXYPROGESTERONE ACETATE 150 MG/ML IM SUSP
150.0000 mg | Freq: Once | INTRAMUSCULAR | Status: AC
  Administered 2021-11-22: 150 mg via INTRAMUSCULAR

## 2021-11-22 NOTE — Progress Notes (Signed)
? ?  NURSE VISIT- INJECTION ? ?SUBJECTIVE:  ?Julie [name] is a 26 y.o. [phone] female here for a Depo Provera for contraception/period management. She is a GYN patient.  ? ?OBJECTIVE:  ?There were no vitals taken for this visit.  ?Appears well, in no apparent distress ? ?Injection administered in: Left upper quad. gluteus ? ?Meds ordered this encounter  ?Medications  ? medroxyPROGESTERone (DEPO-PROVERA) injection 150 mg  ? ? ?ASSESSMENT: ?GYN patient Depo Provera for contraception/period management ?PLAN: ?Follow-up: in 11-13 weeks for next Depo and pap/physical.  ? ?Levy Pupa  ?11/22/2021 ?10:36 AM ? ?

## 2022-01-16 ENCOUNTER — Other Ambulatory Visit: Payer: Self-pay | Admitting: Adult Health

## 2022-01-18 ENCOUNTER — Telehealth: Payer: Self-pay

## 2022-01-18 MED ORDER — MEGESTROL ACETATE 40 MG PO TABS: ORAL_TABLET | ORAL | 0 refills | Status: DC

## 2022-01-18 NOTE — Telephone Encounter (Signed)
Megace 40 mg 1 daily ill bleeding stops ?

## 2022-01-18 NOTE — Telephone Encounter (Signed)
PT CALLED AND STATED THAT [facility] WILL NOT REFILL HER PRESCRIPTION THAT WAS SENT IN TODAY, BECAUSE THE INSTRUCTIONS ARE NOT CLEAR. COULD SOMEONE CALL AND GIVE CLEAR INSTRUCTIONS. ?

## 2022-02-14 ENCOUNTER — Encounter: Payer: Self-pay | Admitting: *Deleted

## 2022-02-14 ENCOUNTER — Ambulatory Visit (INDEPENDENT_AMBULATORY_CARE_PROVIDER_SITE_OTHER): Payer: Commercial Managed Care - PPO | Admitting: *Deleted

## 2022-02-14 DIAGNOSIS — Z3042 Encounter for surveillance of injectable contraceptive: Secondary | ICD-10-CM

## 2022-02-14 MED ORDER — MEDROXYPROGESTERONE ACETATE 150 MG/ML IM SUSP
150.0000 mg | Freq: Once | INTRAMUSCULAR | Status: AC
  Administered 2022-02-14: 150 mg via INTRAMUSCULAR

## 2022-02-14 NOTE — Progress Notes (Signed)
   NURSE VISIT- INJECTION  SUBJECTIVE:  Julie [name] is a 26 y.o. G17P2002 female here for a Depo Provera for contraception/period management. She is a GYN patient.   OBJECTIVE:  There were no vitals taken for this visit.  Appears well, in no apparent distress  Injection administered in: Right upper quad. gluteus  Meds ordered this encounter  Medications   medroxyPROGESTERone (DEPO-PROVERA) injection 150 mg    ASSESSMENT: GYN patient Depo Provera for contraception/period management PLAN: Follow-up: in 11-13 weeks for next Depo   Jobe Marker  02/14/2022 9:43 AM

## 2022-02-22 ENCOUNTER — Telehealth: Payer: Medicaid Other | Admitting: Nurse Practitioner

## 2022-02-22 DIAGNOSIS — N760 Acute vaginitis: Secondary | ICD-10-CM | POA: Diagnosis not present

## 2022-02-22 DIAGNOSIS — B9689 Other specified bacterial agents as the cause of diseases classified elsewhere: Secondary | ICD-10-CM | POA: Diagnosis not present

## 2022-02-22 MED ORDER — METRONIDAZOLE 500 MG PO TABS: 500.0000 mg | ORAL_TABLET | Freq: Two times a day (BID) | ORAL | 0 refills | Status: AC

## 2022-02-22 NOTE — Progress Notes (Signed)
E-Visit for Vaginal Symptoms  We are sorry that you are not feeling well. Here is how we plan to help! Based on what you shared with me it looks like you: May have a vaginosis due to bacteria  Vaginosis is an inflammation of the vagina that can result in discharge, itching and pain. The cause is usually a change in the normal balance of vaginal bacteria or an infection. Vaginosis can also result from reduced estrogen levels after menopause.  The most common causes of vaginosis are:   Bacterial vaginosis which results from an overgrowth of one on several organisms that are normally present in your vagina.   Yeast infections which are caused by a naturally occurring fungus called candida.   Vaginal atrophy (atrophic vaginosis) which results from the thinning of the vagina from reduced estrogen levels after menopause.   Trichomoniasis which is caused by a parasite and is commonly transmitted by sexual intercourse.  Factors that increase your risk of developing vaginosis include: Medications, such as antibiotics and steroids Uncontrolled diabetes Use of hygiene products such as bubble bath, vaginal spray or vaginal deodorant Douching Wearing damp or tight-fitting clothing Using an intrauterine device (IUD) for birth control Hormonal changes, such as those associated with pregnancy, birth control pills or menopause Sexual activity Having a sexually transmitted infection  Your treatment plan is Metronidazole or Flagyl 500mg twice a day for 7 days.  I have electronically sent this prescription into the pharmacy that you have chosen.  Be sure to take all of the medication as directed. Stop taking any medication if you develop a rash, tongue swelling or shortness of breath. Mothers who are breast feeding should consider pumping and discarding their breast milk while on these antibiotics. However, there is no consensus that infant exposure at these doses would be harmful.  Remember that  medication creams can weaken latex condoms. .   HOME CARE:  Good hygiene may prevent some types of vaginosis from recurring and may relieve some symptoms:  Avoid baths, hot tubs and whirlpool spas. Rinse soap from your outer genital area after a shower, and dry the area well to prevent irritation. Don't use scented or harsh soaps, such as those with deodorant or antibacterial action. Avoid irritants. These include scented tampons and pads. Wipe from front to back after using the toilet. Doing so avoids spreading fecal bacteria to your vagina.  Other things that may help prevent vaginosis include:  Don't douche. Your vagina doesn't require cleansing other than normal bathing. Repetitive douching disrupts the normal organisms that reside in the vagina and can actually increase your risk of vaginal infection. Douching won't clear up a vaginal infection. Use a latex condom. Both female and female latex condoms may help you avoid infections spread by sexual contact. Wear cotton underwear. Also wear pantyhose with a cotton crotch. If you feel comfortable without it, skip wearing underwear to bed. Yeast thrives in moist environments Your symptoms should improve in the next day or two.  GET HELP RIGHT AWAY IF:  You have pain in your lower abdomen ( pelvic area or over your ovaries) You develop nausea or vomiting You develop a fever Your discharge changes or worsens You have persistent pain with intercourse You develop shortness of breath, a rapid pulse, or you faint.  These symptoms could be signs of problems or infections that need to be evaluated by a medical provider now.  MAKE SURE YOU   Understand these instructions. Will watch your condition. Will get help right   away if you are not doing well or get worse.  Thank you for choosing an e-visit.  Your e-visit answers were reviewed by a board certified advanced clinical practitioner to complete your personal care plan. Depending upon the  condition, your plan could have included both over the counter or prescription medications.  Please review your pharmacy choice. Make sure the pharmacy is open so you can pick up prescription now. If there is a problem, you may contact your provider through [employer] and have the prescription routed to another pharmacy.  Your safety is important to [place]. If you have drug allergies check your prescription carefully.   For the next 24 hours you can use MyChart to ask questions about today's visit, request a non-urgent call back, or ask for a work or school excuse. You will get an email in the next two days asking about your experience. I hope that your e-visit has been valuable and will speed your recovery.   I spent approximately 7 minutes reviewing the patient's history, current symptoms and coordinating their plan of care today.    Meds ordered this encounter  Medications   metroNIDAZOLE (FLAGYL) 500 MG tablet    Sig: Take 1 tablet (500 mg total) by mouth 2 (two) times daily for 7 days.    Dispense:  14 tablet    Refill:  0

## 2022-03-01 MED ORDER — ONDANSETRON HCL 4 MG PO TABS: 4.0000 mg | ORAL_TABLET | Freq: Three times a day (TID) | ORAL | 0 refills | Status: DC | PRN

## 2022-03-01 NOTE — Addendum Note (Signed)
Addended by: Margaretann Loveless on: 03/01/2022 09:37 AM   Modules accepted: Orders

## 2022-05-08 ENCOUNTER — Other Ambulatory Visit: Payer: Self-pay | Admitting: Adult Health

## 2022-05-09 ENCOUNTER — Ambulatory Visit: Payer: Medicaid Other

## 2022-05-10 ENCOUNTER — Ambulatory Visit: Payer: Medicaid Other

## 2022-05-10 ENCOUNTER — Ambulatory Visit (INDEPENDENT_AMBULATORY_CARE_PROVIDER_SITE_OTHER): Payer: Commercial Managed Care - PPO | Admitting: *Deleted

## 2022-05-10 DIAGNOSIS — Z3042 Encounter for surveillance of injectable contraceptive: Secondary | ICD-10-CM | POA: Diagnosis not present

## 2022-05-10 MED ORDER — MEDROXYPROGESTERONE ACETATE 150 MG/ML IM SUSP
150.0000 mg | Freq: Once | INTRAMUSCULAR | Status: AC
Start: 2022-05-10 — End: 2022-05-10
  Administered 2022-05-10: 150 mg via INTRAMUSCULAR

## 2022-05-10 NOTE — Progress Notes (Signed)
   NURSE VISIT- INJECTION  SUBJECTIVE:  Julie [name] is a 26 y.o. G4P2002 female here for a Depo Provera for contraception/period management. She is a GYN patient.   OBJECTIVE:  There were no vitals taken for this visit.  Appears well, in no apparent distress  Injection administered in: Left upper quad. gluteus  Meds ordered this encounter  Medications   medroxyPROGESTERone (DEPO-PROVERA) injection 150 mg    ASSESSMENT: GYN patient Depo Provera for contraception/period management PLAN: Follow-up: in 11-13 weeks for next Depo   Annamarie Dawley  05/10/2022 2:42 PM

## 2022-07-04 DIAGNOSIS — R69 Illness, unspecified: Secondary | ICD-10-CM | POA: Diagnosis not present

## 2022-07-04 DIAGNOSIS — R5381 Other malaise: Secondary | ICD-10-CM | POA: Diagnosis not present

## 2022-08-02 ENCOUNTER — Ambulatory Visit (INDEPENDENT_AMBULATORY_CARE_PROVIDER_SITE_OTHER): Payer: Commercial Managed Care - PPO | Admitting: *Deleted

## 2022-08-02 DIAGNOSIS — Z3042 Encounter for surveillance of injectable contraceptive: Secondary | ICD-10-CM | POA: Diagnosis not present

## 2022-08-02 MED ORDER — MEDROXYPROGESTERONE ACETATE 150 MG/ML IM SUSP
150.0000 mg | Freq: Once | INTRAMUSCULAR | Status: AC
  Administered 2022-08-02: 150 mg via INTRAMUSCULAR

## 2022-08-02 NOTE — Progress Notes (Signed)
   NURSE VISIT- INJECTION  SUBJECTIVE:  Julie [name] is a 26 y.o. G7P2002 female here for a Depo Provera for contraception/period management. She is a GYN patient.   OBJECTIVE:  There were no vitals taken for this visit.  Appears well, in no apparent distress  Injection administered in: Right upper quad. gluteus  Meds ordered this encounter  Medications   medroxyPROGESTERone (DEPO-PROVERA) injection 150 mg    ASSESSMENT: GYN patient Depo Provera for contraception/period management PLAN: Follow-up: in 11-13 weeks for next Depo   Jobe Marker  08/02/2022 2:56 PM

## 2022-09-29 DIAGNOSIS — Z23 Encounter for immunization: Secondary | ICD-10-CM | POA: Diagnosis not present

## 2022-10-19 ENCOUNTER — Ambulatory Visit: Payer: Medicaid Other

## 2022-10-31 ENCOUNTER — Ambulatory Visit (INDEPENDENT_AMBULATORY_CARE_PROVIDER_SITE_OTHER): Payer: Medicaid Other | Admitting: *Deleted

## 2022-10-31 DIAGNOSIS — Z3042 Encounter for surveillance of injectable contraceptive: Secondary | ICD-10-CM

## 2022-10-31 MED ORDER — MEDROXYPROGESTERONE ACETATE 150 MG/ML IM SUSP
150.0000 mg | Freq: Once | INTRAMUSCULAR | Status: AC
  Administered 2022-10-31: 150 mg via INTRAMUSCULAR

## 2022-10-31 NOTE — Progress Notes (Signed)
   NURSE VISIT- INJECTION  SUBJECTIVE:  Julie [name] is a 27 y.o. G72P2002 female here for a Depo Provera for contraception/period management. She is a GYN patient.   OBJECTIVE:  There were no vitals taken for this visit.  Appears well, in no apparent distress  Injection administered in: Left upper quad. gluteus  Meds ordered this encounter  Medications   medroxyPROGESTERone (DEPO-PROVERA) injection 150 mg    ASSESSMENT: GYN patient Depo Provera for contraception/period management PLAN: Follow-up: in 11-13 weeks for next Depo   Alice Rieger  10/31/2022 10:14 AM

## 2023-01-23 ENCOUNTER — Ambulatory Visit: Payer: Medicaid Other

## 2023-01-24 ENCOUNTER — Ambulatory Visit (INDEPENDENT_AMBULATORY_CARE_PROVIDER_SITE_OTHER): Payer: Medicaid Other | Admitting: *Deleted

## 2023-01-24 DIAGNOSIS — Z3042 Encounter for surveillance of injectable contraceptive: Secondary | ICD-10-CM

## 2023-01-24 MED ORDER — MEDROXYPROGESTERONE ACETATE 150 MG/ML IM SUSY
150.0000 mg | PREFILLED_SYRINGE | Freq: Once | INTRAMUSCULAR | Status: AC
  Administered 2023-01-24: 150 mg via INTRAMUSCULAR

## 2023-01-24 NOTE — Progress Notes (Signed)
   NURSE VISIT- INJECTION  SUBJECTIVE:  Julie [name] is a 27 y.o. G70P2002 female here for a Depo Provera for contraception/period management. She is a GYN patient.   OBJECTIVE:  There were no vitals taken for this visit.  Appears well, in no apparent distress  Injection administered in: Right upper quad. gluteus  Meds ordered this encounter  Medications   medroxyPROGESTERone Acetate [name] 150 mg    ASSESSMENT: GYN patient Depo Provera for contraception/period management PLAN: Follow-up: in 11-13 weeks for next Depo. Schedule pap/physical within 12 weeks.    [name]  01/24/2023 3:25 PM

## 2023-04-17 ENCOUNTER — Ambulatory Visit (INDEPENDENT_AMBULATORY_CARE_PROVIDER_SITE_OTHER): Payer: Medicaid Other | Admitting: *Deleted

## 2023-04-17 ENCOUNTER — Other Ambulatory Visit: Payer: Self-pay | Admitting: Adult Health

## 2023-04-17 DIAGNOSIS — Z3042 Encounter for surveillance of injectable contraceptive: Secondary | ICD-10-CM

## 2023-04-17 MED ORDER — MEDROXYPROGESTERONE ACETATE 150 MG/ML IM SUSP
150.0000 mg | Freq: Once | INTRAMUSCULAR | Status: AC
  Administered 2023-04-17: 150 mg via INTRAMUSCULAR

## 2023-04-17 NOTE — Progress Notes (Signed)
   NURSE VISIT- INJECTION  SUBJECTIVE:  Julie [name] is a 27 y.o. G56P2002 female here for a Depo Provera for contraception/period management. She is a GYN patient.   OBJECTIVE:  There were no vitals taken for this visit.  Appears well, in no apparent distress  Injection administered in: Left upper quad. gluteus  Meds ordered this encounter  Medications   medroxyPROGESTERone (DEPO-PROVERA) injection 150 mg    ASSESSMENT: GYN patient Depo Provera for contraception/period management PLAN: Follow-up: in 11-13 weeks for next Depo   Annamarie Dawley  04/17/2023 4:22 PM

## 2023-04-18 ENCOUNTER — Ambulatory Visit: Payer: Medicaid Other

## 2023-05-10 ENCOUNTER — Other Ambulatory Visit: Payer: Self-pay | Admitting: Advanced Practice Midwife

## 2023-05-10 MED ORDER — MEGESTROL ACETATE 40 MG PO TABS: ORAL_TABLET | ORAL | 3 refills | Status: DC

## 2023-05-17 ENCOUNTER — Emergency Department (HOSPITAL_COMMUNITY)
Admission: EM | Admit: 2023-05-17 | Discharge: 2023-05-17 | Disposition: A | Discharge status: Home / Self Care | Payer: Medicaid Other | Attending: Emergency Medicine | Admitting: Emergency Medicine

## 2023-05-17 ENCOUNTER — Emergency Department (HOSPITAL_COMMUNITY): Payer: Medicaid Other

## 2023-05-17 ENCOUNTER — Other Ambulatory Visit: Payer: Self-pay

## 2023-05-17 DIAGNOSIS — J019 Acute sinusitis, unspecified: Secondary | ICD-10-CM

## 2023-05-17 DIAGNOSIS — R112 Nausea with vomiting, unspecified: Secondary | ICD-10-CM | POA: Diagnosis not present

## 2023-05-17 DIAGNOSIS — J0110 Acute frontal sinusitis, unspecified: Secondary | ICD-10-CM | POA: Insufficient documentation

## 2023-05-17 DIAGNOSIS — R109 Unspecified abdominal pain: Secondary | ICD-10-CM | POA: Diagnosis not present

## 2023-05-17 DIAGNOSIS — Z1152 Encounter for screening for COVID-19: Secondary | ICD-10-CM | POA: Insufficient documentation

## 2023-05-17 DIAGNOSIS — D72829 Elevated white blood cell count, unspecified: Secondary | ICD-10-CM

## 2023-05-17 DIAGNOSIS — M791 Myalgia, unspecified site: Secondary | ICD-10-CM | POA: Diagnosis present

## 2023-05-17 LAB — COMPREHENSIVE METABOLIC PANEL
ALT: 15 U/L (ref 0–44)
AST: 15 U/L (ref 15–41)
Albumin: 3.9 g/dL (ref 3.5–5.0)
Alkaline Phosphatase: 61 U/L (ref 38–126)
Anion gap: 12 (ref 5–15)
BUN: 9 mg/dL (ref 6–20)
CO2: 22 mmol/L (ref 22–32)
Calcium: 9.3 mg/dL (ref 8.9–10.3)
Chloride: 103 mmol/L (ref 98–111)
Creatinine, Ser: 0.81 mg/dL (ref 0.44–1.00)
GFR, Estimated: >60 mL/min (ref >60)
Glucose, Bld: 83 mg/dL (ref 70–99)
Potassium: 3.3 mmol/L — ABNORMAL LOW (ref 3.5–5.1)
Sodium: 137 mmol/L (ref 135–145)
Total Bilirubin: 0.7 mg/dL (ref 0.3–1.2)
Total Protein: 8.2 g/dL — ABNORMAL HIGH (ref 6.5–8.1)

## 2023-05-17 LAB — URINALYSIS, ROUTINE W REFLEX MICROSCOPIC
Bilirubin Urine: NEGATIVE
Glucose, UA: NEGATIVE mg/dL
Ketones, ur: 20 mg/dL — AB
Leukocytes,Ua: NEGATIVE
Nitrite: NEGATIVE
Protein, ur: 30 mg/dL — AB
Specific Gravity, Urine: 1.025 (ref 1.005–1.030)
pH: 6 (ref 5.0–8.0)

## 2023-05-17 LAB — CBC WITH DIFFERENTIAL/PLATELET
Abs Immature Granulocytes: 0.09 10*3/uL — ABNORMAL HIGH (ref 0.00–0.07)
Basophils Absolute: 0.1 10*3/uL (ref 0.0–0.1)
Basophils Relative: 0 %
Eosinophils Absolute: 0 10*3/uL (ref 0.0–0.5)
Eosinophils Relative: 0 %
HCT: 43.4 % (ref 36.0–46.0)
Hemoglobin: 14 g/dL (ref 12.0–15.0)
Immature Granulocytes: 1 %
Lymphocytes Relative: 9 %
Lymphs Abs: 1.5 10*3/uL (ref 0.7–4.0)
MCH: 29.8 pg (ref 26.0–34.0)
MCHC: 32.3 g/dL (ref 30.0–36.0)
MCV: 92.3 fL (ref 80.0–100.0)
Monocytes Absolute: 1 10*3/uL (ref 0.1–1.0)
Monocytes Relative: 6 %
Neutro Abs: 14.4 10*3/uL — ABNORMAL HIGH (ref 1.7–7.7)
Neutrophils Relative %: 84 %
RBC: 4.7 MIL/uL (ref 3.87–5.11)
RDW: 12.8 % (ref 11.5–15.5)
Smear Review: DECREASED
WBC: 17.1 10*3/uL — ABNORMAL HIGH (ref 4.0–10.5)
nRBC: 0 % (ref 0.0–0.2)

## 2023-05-17 LAB — LACTIC ACID, PLASMA: Lactic Acid, Venous: 1.4 mmol/L (ref 0.5–1.9)

## 2023-05-17 LAB — PLATELET COUNT: Platelets: 253 10*3/uL (ref 150–400)

## 2023-05-17 LAB — LIPASE, BLOOD: Lipase: 22 U/L (ref 11–51)

## 2023-05-17 LAB — PREGNANCY, URINE: Preg Test, Ur: NEGATIVE

## 2023-05-17 LAB — SARS CORONAVIRUS 2 BY RT PCR: SARS Coronavirus 2 by RT PCR: NEGATIVE

## 2023-05-17 MED ORDER — AMOXICILLIN-POT CLAVULANATE 875-125 MG PO TABS
1.0000 | ORAL_TABLET | Freq: Once | ORAL | Status: AC
  Administered 2023-05-17: 1 via ORAL
  Filled 2023-05-17: qty 1

## 2023-05-17 MED ORDER — LACTATED RINGERS IV BOLUS
1000.0000 mL | Freq: Once | INTRAVENOUS | Status: AC
  Administered 2023-05-17: 1000 mL via INTRAVENOUS

## 2023-05-17 MED ORDER — ONDANSETRON HCL 4 MG/2ML IJ SOLN
4.0000 mg | Freq: Once | INTRAMUSCULAR | Status: AC
  Administered 2023-05-17: 4 mg via INTRAVENOUS
  Filled 2023-05-17: qty 2

## 2023-05-17 MED ORDER — ACETAMINOPHEN 325 MG PO TABS
650.0000 mg | ORAL_TABLET | Freq: Once | ORAL | Status: AC
  Administered 2023-05-17: 650 mg via ORAL
  Filled 2023-05-17: qty 2

## 2023-05-17 MED ORDER — IOHEXOL 300 MG/ML  SOLN
80.0000 mL | Freq: Once | INTRAMUSCULAR | Status: AC | PRN
  Administered 2023-05-17: 80 mL via INTRAVENOUS

## 2023-05-17 MED ORDER — AMOXICILLIN-POT CLAVULANATE 875-125 MG PO TABS: 1.0000 | ORAL_TABLET | Freq: Two times a day (BID) | ORAL | 0 refills | Status: DC

## 2023-05-17 MED ORDER — ONDANSETRON 4 MG PO TBDP: 4.0000 mg | ORAL_TABLET | Freq: Four times a day (QID) | ORAL | 0 refills | Status: DC | PRN

## 2023-05-17 NOTE — Discharge Instructions (Addendum)
Was a pleasure taking care of you today.  You were seen for body aches, congestion, nausea and vomiting.  Your white blood cell count was very high today and we are concerned for infection.  We are to treat with something called Augmentin which will treat your sinusitis.  Not find any definite source of bacterial infection but will presumably treat for sinus infection.  We did send urine culture and blood culture.  If positive you will get a call.  It is extremely important for you to follow-up with your primary care doctor next week for recheck and repeat labs.  If you start having any new or worsening symptoms you need to come back to the ER right away.

## 2023-05-17 NOTE — ED Triage Notes (Signed)
Body aches all over Decreased app HA Nausea,, vomiting and dizziness, since yesterday   Denies cough and fever,

## 2023-05-17 NOTE — ED Provider Notes (Signed)
[place] EMERGENCY DEPARTMENT AT [facility] Provider Note   CSN: [id number] Arrival date & time: 05/17/23  1515     History  Chief Complaint  Patient presents with   Generalized Body Aches    Julie [name] is a 27 y.o. female.  No significant PMH.  Presents ER today complaining of generalized bodyaches, nausea and vomiting and mild diffuse abdominal pain.  Denies urinary symptoms, no diarrhea, no hematemesis hematochezia.  States he is not able to keep anything down since yesterday.  Denies fevers or chills.  States her entire body is achy.  She denies any sick contacts.  HPI     Home Medications Prior to Admission medications   Medication Sig Start Date End Date Taking? Authorizing Provider  amoxicillin-clavulanate (AUGMENTIN) 875-125 MG tablet Take 1 tablet by mouth every 12 (twelve) hours. 05/17/23  Yes Anaria Kroner A, PA-C  ondansetron (ZOFRAN-ODT) 4 MG disintegrating tablet Take 1 tablet (4 mg total) by mouth every 6 (six) hours as needed for nausea or vomiting. 05/17/23  Yes Sameria Morss A, PA-C  meclizine (ANTIVERT) 12.5 MG tablet Take 1 tablet (12.5 mg total) by mouth 3 (three) times daily as needed for dizziness. 01/06/21   Placido Sou, PA-C  medroxyPROGESTERone (DEPO-PROVERA) 150 MG/ML injection ADMINISTER 1 ML(150 MG) IN THE MUSCLE EVERY 3 MONTHS 04/17/23   Adline Potter, NP  megestrol (MEGACE) 40 MG tablet Take 1-2 daily till bleeding stops 05/10/23   Jacklyn Shell, CNM  Pediatric Multiple Vit-C-FA (FLINSTONES GUMMIES OMEGA-3 DHA PO) Take 2 each by mouth daily.     [provider]      Allergies    Bee venom and Metronidazole    Review of Systems   Review of Systems  Physical Exam Updated Vital Signs BP 136/78   Pulse 83   Temp 98.5 F (36.9 C) (Oral)   Resp 18   Ht 5\' 5"  (1.651 m)   Wt 69.7 kg   SpO2 96%   BMI 25.58 kg/m  Physical Exam Vitals and nursing note reviewed.  Constitutional:      General: She  is not in acute distress.    Appearance: She is well-developed.  HENT:     Head: Normocephalic and atraumatic.     Nose: Congestion present.     Right Turbinates: Swollen.     Left Turbinates: Swollen.     Right Sinus: Frontal sinus tenderness present.     Left Sinus: Frontal sinus tenderness present.     Mouth/Throat:     Mouth: Mucous membranes are moist.  Eyes:     Conjunctiva/sclera: Conjunctivae normal.  Cardiovascular:     Rate and Rhythm: Normal rate and regular rhythm.     Heart sounds: No murmur heard. Pulmonary:     Effort: Pulmonary effort is normal. No respiratory distress.     Breath sounds: Normal breath sounds.  Abdominal:     Palpations: Abdomen is soft.     Tenderness: There is abdominal tenderness.     Comments: And is flat and soft with mild diffuse tenderness.  No rebound guarding or rigidity.  Negative Murphy sign.  Musculoskeletal:        General: No swelling.     Cervical back: Neck supple.  Skin:    General: Skin is warm and dry.     Capillary Refill: Capillary refill takes less than 2 seconds.  Neurological:     General: No focal deficit present.     Mental Status: She is  alert and oriented to person, place, and time.     Sensory: Sensory deficit present.     Motor: No weakness.  Psychiatric:        Mood and Affect: Mood normal.     ED Results / Procedures / Treatments   Labs (all labs ordered are listed, but only abnormal results are displayed) Labs Reviewed  CBC WITH DIFFERENTIAL/PLATELET - Abnormal; Notable for the following components:      Result Value   WBC 17.1 (*)    Neutro Abs 14.4 (*)    Abs Immature Granulocytes 0.09 (*)    All other components within normal limits  URINALYSIS, ROUTINE W REFLEX MICROSCOPIC - Abnormal; Notable for the following components:   Hgb urine dipstick MODERATE (*)    Ketones, ur 20 (*)    Protein, ur 30 (*)    Bacteria, UA RARE (*)    All other components within normal limits  COMPREHENSIVE METABOLIC  PANEL - Abnormal; Notable for the following components:   Potassium 3.3 (*)    Total Protein 8.2 (*)    All other components within normal limits  SARS CORONAVIRUS 2 BY RT PCR  CULTURE, BLOOD (ROUTINE X 2)  CULTURE, BLOOD (ROUTINE X 2) W REFLEX TO ID PANEL  URINE CULTURE  PREGNANCY, URINE  LIPASE, BLOOD  LACTIC ACID, PLASMA  PLATELET COUNT    EKG None  Radiology CT ABDOMEN PELVIS W CONTRAST  Result Date: 05/17/2023 CLINICAL DATA:  Abdominal pain, acute, nonlocalized. Nausea, vomiting, dizziness, and body aches. EXAM: CT ABDOMEN AND PELVIS WITH CONTRAST TECHNIQUE: Multidetector CT imaging of the abdomen and pelvis was performed using the standard protocol following bolus administration of intravenous contrast. RADIATION DOSE REDUCTION: This exam was performed according to the departmental dose-optimization program which includes automated exposure control, adjustment of the mA and/or kV according to patient size and/or use of iterative reconstruction technique. CONTRAST:  80mL OMNIPAQUE IOHEXOL 300 MG/ML  SOLN COMPARISON:  None Available. FINDINGS: Lower chest: No acute abnormality. Hepatobiliary: No focal liver abnormality is seen. No gallstones, gallbladder wall thickening, or biliary dilatation. Pancreas: Unremarkable. No pancreatic ductal dilatation or surrounding inflammatory changes. Spleen: Normal in size without focal abnormality. Adrenals/Urinary Tract: The adrenal glands are within normal limits. The kidneys enhance symmetrically. Cyst is noted in the mid right kidney. Subcentimeter hypodensities are noted bilaterally which are too small to further characterize. No renal calculus or obstructive uropathy. The bladder is unremarkable. Stomach/Bowel: There is a small hiatal hernia. Stomach is within normal limits. Appendix appears normal. No evidence of bowel wall thickening, distention, or inflammatory changes. No free air or pneumatosis. Vascular/Lymphatic: No significant vascular  findings are present. Nonspecific prominent lymph nodes are present in the mesentery in the right lower quadrant. Reproductive: Uterus and bilateral adnexa are unremarkable. Other: A trace amount of free fluid is noted in the cul-de-sac, likely physiologic. Musculoskeletal: Degenerative changes are present in the thoracolumbar spine. IMPRESSION: 1. No acute intra-abdominal process. 2. Small hiatal hernia. Electronically Signed   By: Thornell Sartorius M.D.   On: 05/17/2023 21:40   DG Chest Portable 1 View  Result Date: 05/17/2023 CLINICAL DATA:  Body aches EXAM: PORTABLE CHEST 1 VIEW COMPARISON:  Chest x-ray 05/27/2020 FINDINGS: The heart size and mediastinal contours are within normal limits. Both lungs are clear. The visualized skeletal structures are unremarkable. IMPRESSION: No active disease. Electronically Signed   By: Darliss Cheney M.D.   On: 05/17/2023 20:53    Procedures Procedures    Medications Ordered in  ED Medications  lactated ringers bolus 1,000 mL (0 mLs Intravenous Stopped 05/17/23 1913)  ondansetron (ZOFRAN) injection 4 mg (4 mg Intravenous Given 05/17/23 1811)  acetaminophen (TYLENOL) tablet 650 mg (650 mg Oral Given 05/17/23 1913)  iohexol (OMNIPAQUE) 300 MG/ML solution 80 mL (80 mLs Intravenous Contrast Given 05/17/23 2007)  amoxicillin-clavulanate (AUGMENTIN) 875-125 MG per tablet 1 tablet (1 tablet Oral Given 05/17/23 2246)    ED Course/ Medical Decision Making/ A&P Clinical Course as of 05/17/23 2337  Thu May 17, 2023  1735 Initial impression and plan: Patient here for body aches nausea vomiting and mild diffuse abdominal discomfort.  She is well-appearing but mildly tachycardic on my exam.  Not taking any p.o. fluids at home.  Plan on labs, nausea medicines, IV fluids and will reevaluate.  Her abdominal exam is overall reassuring she is some mild tenderness but very diffuse [CB]    Clinical Course User Index [CB] Ma Rings, PA-C                                  Medical Decision Making This patient presents to the ED for concern of nausea vomiting and bodyaches this involves an extensive number of treatment options, and is a complaint that carries with it a high risk of complications and morbidity.  The differential diagnosis includes gastritis, gastroenteritis, cholecystitis, UTI, pancreatitis, other   Co morbidities that complicate the patient evaluation :   None   Additional history obtained:  Additional history obtained from EMR External records from outside source obtained and reviewed including notes     Imaging Studies ordered:  I ordered imaging studies including CT abdomen pelvis which shows no acute intra-abdominal process I independently visualized and interpreted imaging within scope of identifying emergent findings  I agree with the radiologist interpretation     Problem List / ED Course / Critical interventions / Medication management  Body aches with nausea vomiting, mild diffuse abdominal pain, sinus congestion.  Patient has overall reassuring exam and vitals but did have a significant leukocytosis with toxic granulation on her labs.  Blood culture sent, urine culture sent.  She not having dysuria but does have some blood in the urine.  Patient is not pregnant.  Lactic acid is normal, she is feeling much better after fluids and Tylenol.  She has not had any vomiting in the ER.  Tolerating fluids here. Given her leukocytosis, chest x-ray and CT abdomen pelvis did not show any infectious sources.  She is having congestion with sinus tenderness and has a wheelchair but inflammation.  Given laboratory findings we will treat for possible acute sinusitis.  She is advised on close PCP follow-up and strict return precautions. I ordered medication including Zofran for nausea and vomiting Reevaluation of the patient after these medicines showed that the patient improved I have reviewed the patients home medicines and have made  adjustments as needed      Amount and/or Complexity of Data Reviewed Labs: ordered. Decision-making details documented in ED Course. Radiology: ordered.  Risk OTC drugs. Prescription drug management.           Final Clinical Impression(s) / ED Diagnoses Final diagnoses:  Leukocytosis, unspecified type  Acute sinusitis, recurrence not specified, unspecified location  Nausea and vomiting, unspecified vomiting type    Rx / DC Orders ED Discharge Orders          Ordered    amoxicillin-clavulanate (AUGMENTIN) 875-125 MG  tablet  Every 12 hours        05/17/23 2243    ondansetron (ZOFRAN-ODT) 4 MG disintegrating tablet  Every 6 hours PRN        05/17/23 2243              Josem Kaufmann 05/17/23 2338    Bethann Berkshire, MD 05/18/23 1045

## 2023-05-21 LAB — URINE CULTURE: Culture: 20000 — AB

## 2023-05-22 ENCOUNTER — Telehealth (HOSPITAL_BASED_OUTPATIENT_CLINIC_OR_DEPARTMENT_OTHER): Payer: Self-pay | Admitting: *Deleted

## 2023-05-22 LAB — CULTURE, BLOOD (ROUTINE X 2)
Culture: NO GROWTH
Culture: NO GROWTH
Special Requests: ADEQUATE
Special Requests: ADEQUATE

## 2023-05-22 NOTE — Telephone Encounter (Signed)
Post ED Visit - Positive Culture Follow-up  Culture report reviewed by antimicrobial stewardship pharmacist: [facility] [x]  Ruben Im, Pharm.D. []  Celedonio Miyamoto, Pharm.D., BCPS AQ-ID []  Garvin Fila, Pharm.D., BCPS []  Georgina Pillion, Pharm.D., BCPS []  [place], [address].D., BCPS, AAHIVP []  Estella Husk, Pharm.D., BCPS, AAHIVP []  Lysle Pearl, PharmD, BCPS []  Phillips Climes, PharmD, BCPS []  Agapito Games, PharmD, BCPS []  Verlan Friends, PharmD []  Mervyn Gay, PharmD, BCPS []  Vinnie Level, PharmD  [facility] []  Len Childs, PharmD []  Greer Pickerel, PharmD []  Adalberto Cole, PharmD []  Perlie Gold, Rph []  Lonell Face) Jean Rosenthal, PharmD []  Earl Many, PharmD []  Junita Push, PharmD []  Dorna Leitz, PharmD []  Terrilee Files, PharmD []  Lynann Beaver, PharmD []  Keturah Barre, PharmD []  Loralee Pacas, PharmD []  Bernadene Person, PharmD   Positive urine culture Treated with Amoxicillin, organism sensitive to the same and no further patient follow-up is required at this time.  Virl Axe [facility] 05/22/2023, 10:49 AM

## 2023-07-10 ENCOUNTER — Other Ambulatory Visit (HOSPITAL_COMMUNITY)
Admission: RE | Admit: 2023-07-10 | Discharge: 2023-07-10 | Disposition: A | Discharge status: Home / Self Care | Payer: Medicaid Other | Source: Ambulatory Visit | Attending: Adult Health | Admitting: Adult Health

## 2023-07-10 ENCOUNTER — Ambulatory Visit: Payer: Medicaid Other | Admitting: Adult Health

## 2023-07-10 ENCOUNTER — Encounter: Payer: Self-pay | Admitting: Adult Health

## 2023-07-10 VITALS — BP 123/73 | HR 91 | Ht 65.5 in | Wt 154.5 lb

## 2023-07-10 DIAGNOSIS — Z0000 Encounter for general adult medical examination without abnormal findings: Secondary | ICD-10-CM | POA: Diagnosis present

## 2023-07-10 DIAGNOSIS — Z1151 Encounter for screening for human papillomavirus (HPV): Secondary | ICD-10-CM | POA: Diagnosis not present

## 2023-07-10 DIAGNOSIS — N6452 Nipple discharge: Secondary | ICD-10-CM | POA: Diagnosis not present

## 2023-07-10 DIAGNOSIS — Z01419 Encounter for gynecological examination (general) (routine) without abnormal findings: Secondary | ICD-10-CM | POA: Diagnosis not present

## 2023-07-10 DIAGNOSIS — Z3042 Encounter for surveillance of injectable contraceptive: Secondary | ICD-10-CM

## 2023-07-10 NOTE — Progress Notes (Signed)
Patient ID: Julie [name], female   DOB: 05/24/1996, 27 y.o.   MRN: [id number] History of Present Illness: [name] is a 27 year old black female,single, G2P2002 in for a well woman gyn exam and pap. She forgot her depo today, will come back tomorrow for depo. She did say she still has breast milk, both breasts if squeezed and sometimes spontaneous. She does smoke THC.   Current Medications, Allergies, Past Medical History, Past Surgical History, Family History and Social History were reviewed in [employer] record.     Review of Systems: Patient denies any headaches, hearing loss, fatigue, blurred vision, shortness of breath, chest pain, abdominal pain, problems with bowel movements, urination, or intercourse. No joint pain or mood swings.  See HPI for positives    Physical Exam:BP 123/73 (BP Location: Left Arm, Patient Position: Sitting, Cuff Size: Normal)   Pulse 91   Ht 5' 5.5" (1.664 m)   Wt 154 lb 8 oz (70.1 kg)   BMI 25.32 kg/m   General:  Well developed, well nourished, no acute distress Skin:  Warm and dry Neck:  Midline trachea, normal thyroid, good ROM, no lymphadenopathy Lungs; Clear to auscultation bilaterally Breast:  No dominant palpable mass, retraction, or nipple discharge Cardiovascular: Regular rate and rhythm Abdomen:  Soft, non tender, no hepatosplenomegaly Pelvic:  External genitalia is normal in appearance, no lesions.  The vagina is normal in appearance. Urethra has no lesions or masses. The cervix is bulbous, pap with GC/CHL and HR HPV genotyping performed.  Uterus is felt to be normal size, shape, and contour.  No adnexal masses or tenderness noted.Bladder is non tender, no masses felt. Extremities/musculoskeletal:  No swelling or varicosities noted, no clubbing or cyanosis Psych:  No mood changes, alert and cooperative,seems happy AA is 0 Fall risk is low    07/10/2023    2:47 PM 11/15/2020   12:12 PM 10/29/2018    9:58 AM  Depression  screen PHQ 2/9  Decreased Interest 0 0 0  Down, Depressed, Hopeless 0 0 0  PHQ - 2 Score 0 0 0  Altered sleeping 0 0   Tired, decreased energy 0 0   Change in appetite 0 0   Feeling bad or failure about yourself  0 0   Trouble concentrating 0 0   Moving slowly or fidgety/restless 0 0   Suicidal thoughts 0 0   PHQ-9 Score 0 0        07/10/2023    2:48 PM 11/15/2020   12:13 PM  GAD 7 : Generalized Anxiety Score  Nervous, Anxious, on Edge 0 0  Control/stop worrying 0 0  Worry too much - different things 0 0  Trouble relaxing 0 0  Restless 0 0  Easily annoyed or irritable 0 0  Afraid - awful might happen 0 0  Total GAD 7 Score 0 0    Upstream - 07/10/23 1455       Pregnancy Intention Screening   Does the patient want to become pregnant in the next year? No    Does the patient's partner want to become pregnant in the next year? No    Would the patient like to discuss contraceptive options today? No      Contraception Wrap Up   Current Method Hormonal Injection    End Method Hormonal Injection    Contraception Counseling Provided Yes              Examination chaperoned by Freddie Apley RN  Impression and Plan: 1. Routine general medical examination at a health care facility Pap sent Pap in 3 years if normal Physical in 1 year - Cytology - PAP( [place])  2. Encounter for surveillance of injectable contraceptive [Z30.42] Has refills on depo Take MV with calcium and vitamin D Return tomorrow for depo  3. Breast discharge Has milky discharge since son was born 6 years ago Leave breasts alone Decrease THC use Will check TSH and Prolactin leve tomorrow  - TSH - Prolactin  4. Encounter for routine gynecological examination with Papanicolaou smear of cervix Pap sent

## 2023-07-11 ENCOUNTER — Ambulatory Visit (INDEPENDENT_AMBULATORY_CARE_PROVIDER_SITE_OTHER): Payer: Medicaid Other

## 2023-07-11 DIAGNOSIS — Z3042 Encounter for surveillance of injectable contraceptive: Secondary | ICD-10-CM

## 2023-07-11 MED ORDER — MEDROXYPROGESTERONE ACETATE 150 MG/ML IM SUSP
150.0000 mg | Freq: Once | INTRAMUSCULAR | Status: AC
Start: 2023-07-11 — End: 2023-07-11
  Administered 2023-07-11: 150 mg via INTRAMUSCULAR

## 2023-07-11 NOTE — Progress Notes (Signed)
   NURSE VISIT- INJECTION  SUBJECTIVE:  Julie [name] is a 27 y.o. G35P2002 female here for a Depo Provera for contraception/period management. She is a GYN patient.   OBJECTIVE:  There were no vitals taken for this visit.  Appears well, in no apparent distress  Injection administered in: Right upper quad. gluteus  Meds ordered this encounter  Medications   medroxyPROGESTERone (DEPO-PROVERA) injection 150 mg    ASSESSMENT: GYN patient Depo Provera for contraception/period management PLAN: Follow-up: in 11-13 weeks for next Depo   Annamarie Dawley  07/11/2023 9:48 AM

## 2023-07-16 LAB — CYTOLOGY - PAP
Adequacy: ABSENT
Chlamydia: NEGATIVE
Comment: NEGATIVE
Comment: NEGATIVE
Comment: NORMAL
Diagnosis: NEGATIVE
High risk HPV: NEGATIVE
Neisseria Gonorrhea: NEGATIVE

## 2023-08-25 ENCOUNTER — Telehealth: Payer: Medicaid Other

## 2023-08-25 DIAGNOSIS — J020 Streptococcal pharyngitis: Secondary | ICD-10-CM

## 2023-08-26 MED ORDER — AMOXICILLIN 500 MG PO CAPS: 500.0000 mg | ORAL_CAPSULE | Freq: Three times a day (TID) | ORAL | 0 refills | Status: AC

## 2023-08-26 NOTE — Progress Notes (Signed)

## 2023-09-15 ENCOUNTER — Telehealth: Payer: Medicaid Other | Admitting: Family Medicine

## 2023-09-15 DIAGNOSIS — N760 Acute vaginitis: Secondary | ICD-10-CM

## 2023-09-15 MED ORDER — METRONIDAZOLE 0.75 % VA GEL: 1.0000 | Freq: Every day | VAGINAL | 0 refills | Status: AC

## 2023-09-15 MED ORDER — FLUCONAZOLE 150 MG PO TABS
150.0000 mg | ORAL_TABLET | Freq: Once | ORAL | 0 refills | Status: AC
Start: 2023-09-15 — End: 2023-09-15

## 2023-09-15 NOTE — Progress Notes (Signed)
 Patient requested starting medication for bacterial vaginosis.  Pt states she has an adverse reaction of nausea with oral Metronidazole, but would like to try vaginal Metronidazole.

## 2023-09-15 NOTE — Addendum Note (Signed)
 Addended byReed Pandy on: 09/15/2023 06:19 PM   Modules accepted: Orders

## 2023-09-15 NOTE — Progress Notes (Signed)

## 2023-10-03 ENCOUNTER — Ambulatory Visit: Payer: Medicaid Other

## 2023-10-05 ENCOUNTER — Ambulatory Visit: Payer: Medicaid Other | Admitting: *Deleted

## 2023-10-05 DIAGNOSIS — Z3042 Encounter for surveillance of injectable contraceptive: Secondary | ICD-10-CM | POA: Diagnosis not present

## 2023-10-05 MED ORDER — MEDROXYPROGESTERONE ACETATE 150 MG/ML IM SUSP
150.0000 mg | Freq: Once | INTRAMUSCULAR | Status: AC
Start: 2023-10-05 — End: 2023-10-05
  Administered 2023-10-05: 150 mg via INTRAMUSCULAR

## 2023-10-05 NOTE — Progress Notes (Signed)
   NURSE VISIT- INJECTION  SUBJECTIVE:  Julie [name] is a 28 y.o. G50P2002 female here for a Depo Provera for contraception/period management. She is a GYN patient.   OBJECTIVE:  There were no vitals taken for this visit.  Appears well, in no apparent distress  Injection administered in: Left upper quad. gluteus  Meds ordered this encounter  Medications   medroxyPROGESTERone (DEPO-PROVERA) injection 150 mg    ASSESSMENT: GYN patient Depo Provera for contraception/period management PLAN: Follow-up: in 11-13 weeks for next Depo   Jobe Marker  10/05/2023 9:02 AM

## 2023-10-12 ENCOUNTER — Encounter (HOSPITAL_COMMUNITY): Payer: Self-pay

## 2023-10-12 ENCOUNTER — Emergency Department (HOSPITAL_COMMUNITY)
Admission: EM | Admit: 2023-10-12 | Discharge: 2023-10-13 | Disposition: A | Discharge status: Home / Self Care | Payer: Medicaid Other | Attending: Emergency Medicine | Admitting: Emergency Medicine

## 2023-10-12 ENCOUNTER — Other Ambulatory Visit: Payer: Self-pay

## 2023-10-12 DIAGNOSIS — J101 Influenza due to other identified influenza virus with other respiratory manifestations: Secondary | ICD-10-CM | POA: Diagnosis not present

## 2023-10-12 DIAGNOSIS — R7401 Elevation of levels of liver transaminase levels: Secondary | ICD-10-CM | POA: Insufficient documentation

## 2023-10-12 DIAGNOSIS — E876 Hypokalemia: Secondary | ICD-10-CM | POA: Insufficient documentation

## 2023-10-12 DIAGNOSIS — R112 Nausea with vomiting, unspecified: Secondary | ICD-10-CM | POA: Diagnosis present

## 2023-10-12 LAB — COMPREHENSIVE METABOLIC PANEL
ALT: 75 U/L — ABNORMAL HIGH (ref 0–44)
AST: 59 U/L — ABNORMAL HIGH (ref 15–41)
Albumin: 3.9 g/dL (ref 3.5–5.0)
Alkaline Phosphatase: 49 U/L (ref 38–126)
Anion gap: 11 (ref 5–15)
BUN: 13 mg/dL (ref 6–20)
CO2: 25 mmol/L (ref 22–32)
Calcium: 9.6 mg/dL (ref 8.9–10.3)
Chloride: 104 mmol/L (ref 98–111)
Creatinine, Ser: 0.95 mg/dL (ref 0.44–1.00)
GFR, Estimated: >60 mL/min (ref >60)
Glucose, Bld: 95 mg/dL (ref 70–99)
Potassium: 3.2 mmol/L — ABNORMAL LOW (ref 3.5–5.1)
Sodium: 140 mmol/L (ref 135–145)
Total Bilirubin: 0.7 mg/dL (ref 0.0–1.2)
Total Protein: 8.1 g/dL (ref 6.5–8.1)

## 2023-10-12 LAB — CBC WITH DIFFERENTIAL/PLATELET
Abs Immature Granulocytes: 0.03 10*3/uL (ref 0.00–0.07)
Basophils Absolute: 0 10*3/uL (ref 0.0–0.1)
Basophils Relative: 0 %
Eosinophils Absolute: 0 10*3/uL (ref 0.0–0.5)
Eosinophils Relative: 0 %
HCT: 41.4 % (ref 36.0–46.0)
Hemoglobin: 14.2 g/dL (ref 12.0–15.0)
Immature Granulocytes: 0 %
Lymphocytes Relative: 51 %
Lymphs Abs: 4.5 10*3/uL — ABNORMAL HIGH (ref 0.7–4.0)
MCH: 29.7 pg (ref 26.0–34.0)
MCHC: 34.3 g/dL (ref 30.0–36.0)
MCV: 86.6 fL (ref 80.0–100.0)
Monocytes Absolute: 0.8 10*3/uL (ref 0.1–1.0)
Monocytes Relative: 9 %
Neutro Abs: 3.6 10*3/uL (ref 1.7–7.7)
Neutrophils Relative %: 40 %
Platelets: 296 10*3/uL (ref 150–400)
RBC: 4.78 MIL/uL (ref 3.87–5.11)
RDW: 12.6 % (ref 11.5–15.5)
WBC: 9 10*3/uL (ref 4.0–10.5)
nRBC: 0 % (ref 0.0–0.2)

## 2023-10-12 NOTE — ED Triage Notes (Signed)
 Pt from home with c/o flu like sx with N/V/D on Sat at urgent care, pt says she finished Tamiflu and has nausea meds, but they are not working. Pt says she feels worse after taking Tamiflu.

## 2023-10-13 LAB — URINALYSIS, ROUTINE W REFLEX MICROSCOPIC
Bilirubin Urine: NEGATIVE
Glucose, UA: NEGATIVE mg/dL
Ketones, ur: 20 mg/dL — AB
Leukocytes,Ua: NEGATIVE
Nitrite: NEGATIVE
Protein, ur: 30 mg/dL — AB
Specific Gravity, Urine: 1.034 — ABNORMAL HIGH (ref 1.005–1.030)
pH: 6 (ref 5.0–8.0)

## 2023-10-13 MED ORDER — POTASSIUM CHLORIDE CRYS ER 20 MEQ PO TBCR
40.0000 meq | EXTENDED_RELEASE_TABLET | Freq: Once | ORAL | Status: AC
  Administered 2023-10-13: 40 meq via ORAL
  Filled 2023-10-13: qty 2

## 2023-10-13 MED ORDER — ONDANSETRON 4 MG PO TBDP: 4.0000 mg | ORAL_TABLET | Freq: Three times a day (TID) | ORAL | 0 refills | Status: DC | PRN

## 2023-10-13 MED ORDER — POTASSIUM CHLORIDE CRYS ER 20 MEQ PO TBCR: 20.0000 meq | EXTENDED_RELEASE_TABLET | Freq: Two times a day (BID) | ORAL | 0 refills | Status: DC

## 2023-10-13 MED ORDER — SODIUM CHLORIDE 0.9 % IV BOLUS
1000.0000 mL | Freq: Once | INTRAVENOUS | Status: AC
  Administered 2023-10-13: 1000 mL via INTRAVENOUS

## 2023-10-13 MED ORDER — ONDANSETRON HCL 4 MG/2ML IJ SOLN
4.0000 mg | Freq: Once | INTRAMUSCULAR | Status: AC
  Administered 2023-10-13: 4 mg via INTRAVENOUS
  Filled 2023-10-13: qty 2

## 2023-10-13 MED ORDER — LOPERAMIDE HCL 2 MG PO CAPS
4.0000 mg | ORAL_CAPSULE | Freq: Once | ORAL | Status: AC
  Administered 2023-10-13: 4 mg via ORAL
  Filled 2023-10-13: qty 2

## 2023-10-13 MED ORDER — KETOROLAC TROMETHAMINE 30 MG/ML IJ SOLN
30.0000 mg | Freq: Once | INTRAMUSCULAR | Status: AC
  Administered 2023-10-13: 30 mg via INTRAVENOUS
  Filled 2023-10-13: qty 1

## 2023-10-13 NOTE — ED Provider Notes (Signed)
 [place] EMERGENCY DEPARTMENT AT [facility] Provider Note   CSN: [id number] Arrival date & time: 10/12/23  1928     History  Chief Complaint  Patient presents with   Influenza    Julie [name] is a 28 y.o. female.  The history is provided by the patient.  Influenza She was diagnosed with influenza 5 days ago and just completed a course of oseltamivir.  She states that she is continuing to have nausea, vomiting, diarrhea and mild cough productive of yellow sputum.  She denies fever or chills.  She also complains of generalized body aches.  Of note, her symptoms started 3 days before she actually started taking the oseltamivir.   Home Medications Prior to Admission medications   Medication Sig Start Date End Date Taking? Authorizing Provider  medroxyPROGESTERone  (DEPO-PROVERA ) 150 MG/ML injection ADMINISTER 1 ML(150 MG) IN THE MUSCLE EVERY 3 MONTHS 04/17/23   Signa Delon LABOR, NP  megestrol  (MEGACE ) 40 MG tablet Take 1-2 daily till bleeding stops 05/10/23   Newton Mering, CNM  Pediatric Multiple Vit-C-FA (FLINSTONES GUMMIES OMEGA-3 DHA PO) Take 2 each by mouth daily.     [provider]      Allergies    Bee venom and Metronidazole     Review of Systems   Review of Systems  All other systems reviewed and are negative.   Physical Exam Updated Vital Signs BP 131/86 (BP Location: Right Arm)   Pulse 87   Temp 98.6 F (37 C) (Oral)   Resp 20   Ht 5' 5 (1.651 m)   Wt 69.4 kg   SpO2 100%   BMI 25.46 kg/m  Physical Exam Vitals and nursing note reviewed.   28 year old female, resting comfortably and in no acute distress. Vital signs are normal. Oxygen saturation is 100%, which is normal. Head is normocephalic and atraumatic. PERRLA, EOMI. Oropharynx is clear. Neck is nontender and supple without adenopathy. Lungs are clear without rales, wheezes, or rhonchi. Chest is nontender. Heart has regular rate and rhythm without  murmur. Abdomen is soft, flat, nontender. Extremities have no cyanosis or edema, full range of motion is present. Skin is warm and dry without rash. Neurologic: Mental status is normal, cranial nerves are intact, moves all extremities equally.  ED Results / Procedures / Treatments   Labs (all labs ordered are listed, but only abnormal results are displayed) Labs Reviewed  CBC WITH DIFFERENTIAL/PLATELET - Abnormal; Notable for the following components:      Result Value   Lymphs Abs 4.5 (*)    All other components within normal limits  COMPREHENSIVE METABOLIC PANEL - Abnormal; Notable for the following components:   Potassium 3.2 (*)    AST 59 (*)    ALT 75 (*)    All other components within normal limits  URINALYSIS, ROUTINE W REFLEX MICROSCOPIC   Procedures Procedures    Medications Ordered in ED Medications  ondansetron  (ZOFRAN ) injection 4 mg (4 mg Intravenous Given 10/13/23 0109)  sodium chloride  0.9 % bolus 1,000 mL (0 mLs Intravenous Stopped 10/13/23 0213)  loperamide  (IMODIUM ) capsule 4 mg (4 mg Oral Given 10/13/23 0058)  ketorolac  (TORADOL ) 30 MG/ML injection 30 mg (30 mg Intravenous Given 10/13/23 0108)  potassium chloride  SA (KLOR-CON  M) CR tablet 40 mEq (40 mEq Oral Given 10/13/23 0058)    ED Course/ Medical Decision Making/ A&P  Medical Decision Making Amount and/or Complexity of Data Reviewed Labs: ordered.  Risk Prescription drug management.   Influenza with ongoing nausea, vomiting, diarrhea.  It is unclear at this point how much of that is actual ongoing influenza versus side effect of oseltamivir.  No red flags to suggest more serious pathology such as bowel obstruction, diverticulitis, cholecystitis.  I have reviewed her laboratory tests, and my interpretation is mild hypokalemia likely secondary to GI losses, mild elevation of AST and ALT which are likely related to influenza infection, normal CBC.  I have ordered IV fluids,  ondansetron  for nausea, ketorolac  for body aches, loperamide  for diarrhea, oral potassium.  She feels significantly better following above-noted treatment.  I am discharging her with prescription for ondansetron  oral dissolving tablet, oral potassium.  I have advised her to use over-the-counter loperamide  as needed for diarrhea, over-the-counter NSAIDs and acetaminophen  as needed for fever or aching.  Final Clinical Impression(s) / ED Diagnoses Final diagnoses:  Nausea vomiting and diarrhea  Influenza A  Hypokalemia  Elevated transaminase level    Rx / DC Orders ED Discharge Orders          Ordered    ondansetron  (ZOFRAN -ODT) 4 MG disintegrating tablet  Every 8 hours PRN        10/13/23 0251    potassium chloride  SA (KLOR-CON  M) 20 MEQ tablet  2 times daily        10/13/23 0254              Raford Lenis, MD 10/13/23 [phone]

## 2023-10-13 NOTE — Discharge Instructions (Signed)
 Drink plenty of fluids.  Take acetaminophen  and/your ibuprofen  as needed for fever or aching.  Take loperamide  as needed for diarrhea.

## 2023-12-28 ENCOUNTER — Ambulatory Visit: Payer: Medicaid Other

## 2024-01-02 ENCOUNTER — Ambulatory Visit (INDEPENDENT_AMBULATORY_CARE_PROVIDER_SITE_OTHER): Admitting: *Deleted

## 2024-01-02 DIAGNOSIS — Z3042 Encounter for surveillance of injectable contraceptive: Secondary | ICD-10-CM

## 2024-01-02 MED ORDER — MEDROXYPROGESTERONE ACETATE 150 MG/ML IM SUSY
150.0000 mg | PREFILLED_SYRINGE | Freq: Once | INTRAMUSCULAR | Status: AC
  Administered 2024-01-02: 150 mg via INTRAMUSCULAR

## 2024-01-02 NOTE — Progress Notes (Signed)
   NURSE VISIT- INJECTION  SUBJECTIVE:  Julie [name] is a 28 y.o. G49P2002 female here for a Depo Provera  for contraception/period management. She is a GYN patient.   OBJECTIVE:  There were no vitals taken for this visit.  Appears well, in no apparent distress  Injection administered in: Right upper quad. gluteus  Meds ordered this encounter  Medications   medroxyPROGESTERone  Acetate [name] 150 mg    ASSESSMENT: GYN patient Depo Provera  for contraception/period management PLAN: Follow-up: in 11-13 weeks for next Depo. Pt needs refills on Depo.    Alphonso Aschoff  01/02/2024 3:59 PM

## 2024-01-23 ENCOUNTER — Telehealth: Admitting: Physician Assistant

## 2024-01-23 DIAGNOSIS — B9689 Other specified bacterial agents as the cause of diseases classified elsewhere: Secondary | ICD-10-CM

## 2024-01-23 DIAGNOSIS — N760 Acute vaginitis: Secondary | ICD-10-CM | POA: Diagnosis not present

## 2024-01-23 MED ORDER — METRONIDAZOLE 0.75 % VA GEL: 1.0000 | Freq: Every day | VAGINAL | 0 refills | Status: DC

## 2024-01-23 NOTE — Progress Notes (Signed)
 E-Visit for Vaginal Symptoms  We are sorry that you are not feeling well. Here is how we plan to help! Based on what you shared with me it looks like you: May have a vaginosis due to bacteria  Vaginosis is an inflammation of the vagina that can result in discharge, itching and pain. The cause is usually a change in the normal balance of vaginal bacteria or an infection. Vaginosis can also result from reduced estrogen levels after menopause.  The most common causes of vaginosis are:   Bacterial vaginosis which results from an overgrowth of one on several organisms that are normally present in your vagina.   Yeast infections which are caused by a naturally occurring fungus called candida.   Vaginal atrophy (atrophic vaginosis) which results from the thinning of the vagina from reduced estrogen levels after menopause.   Trichomoniasis which is caused by a parasite and is commonly transmitted by sexual intercourse.  Factors that increase your risk of developing vaginosis include: Medications, such as antibiotics and steroids Uncontrolled diabetes Use of hygiene products such as bubble bath, vaginal spray or vaginal deodorant Douching Wearing damp or tight-fitting clothing Using an intrauterine device (IUD) for birth control Hormonal changes, such as those associated with pregnancy, birth control pills or menopause Sexual activity Having a sexually transmitted infection  Your treatment plan is Metronidazole  Vaginal gel Insert one applicatorful nightly for 7 nights.  Be sure to take all of the medication as directed. Stop taking any medication if you develop a rash, tongue swelling or shortness of breath. Mothers who are breast feeding should consider pumping and discarding their breast milk while on these antibiotics. However, there is no consensus that infant exposure at these doses would be harmful.  Remember that medication creams can weaken latex condoms. Aaron Aas   HOME CARE:  Good  hygiene may prevent some types of vaginosis from recurring and may relieve some symptoms:  Avoid baths, hot tubs and whirlpool spas. Rinse soap from your outer genital area after a shower, and dry the area well to prevent irritation. Don't use scented or harsh soaps, such as those with deodorant or antibacterial action. Avoid irritants. These include scented tampons and pads. Wipe from front to back after using the toilet. Doing so avoids spreading fecal bacteria to your vagina.  Other things that may help prevent vaginosis include:  Don't douche. Your vagina doesn't require cleansing other than normal bathing. Repetitive douching disrupts the normal organisms that reside in the vagina and can actually increase your risk of vaginal infection. Douching won't clear up a vaginal infection. Use a latex condom. Both female and female latex condoms may help you avoid infections spread by sexual contact. Wear cotton underwear. Also wear pantyhose with a cotton crotch. If you feel comfortable without it, skip wearing underwear to bed. Yeast thrives in [employer] Your symptoms should improve in the next day or two.  GET HELP RIGHT AWAY IF:  You have pain in your lower abdomen ( pelvic area or over your ovaries) You develop nausea or vomiting You develop a fever Your discharge changes or worsens You have persistent pain with intercourse You develop shortness of breath, a rapid pulse, or you faint.  These symptoms could be signs of problems or infections that need to be evaluated by a medical provider now.  MAKE SURE YOU   Understand these instructions. Will watch your condition. Will get help right away if you are not doing well or get worse.  Thank you for  choosing an e-visit.  Your e-visit answers were reviewed by a board certified advanced clinical practitioner to complete your personal care plan. Depending upon the condition, your plan could have included both over the counter or  prescription medications.  Please review your pharmacy choice. Make sure the pharmacy is open so you can pick up prescription now. If there is a problem, you may contact your provider through [employer] and have the prescription routed to another pharmacy.  Your safety is important to us . If you have drug allergies check your prescription carefully.   For the next 24 hours you can use MyChart to ask questions about today's visit, request a non-urgent call back, or ask for a work or school excuse. You will get an email in the next two days asking about your experience. I hope that your e-visit has been valuable and will speed your recovery.    I have spent 5 minutes in review of e-visit questionnaire, review and updating patient chart, medical decision making and response to patient.   Angelia Kelp, PA-C

## 2024-02-13 ENCOUNTER — Telehealth: Admitting: Physician Assistant

## 2024-02-13 DIAGNOSIS — B3731 Acute candidiasis of vulva and vagina: Secondary | ICD-10-CM

## 2024-02-13 MED ORDER — FLUCONAZOLE 150 MG PO TABS: 150.0000 mg | ORAL_TABLET | Freq: Once | ORAL | 0 refills | Status: AC

## 2024-02-13 NOTE — Progress Notes (Signed)

## 2024-03-26 ENCOUNTER — Ambulatory Visit

## 2024-04-01 ENCOUNTER — Other Ambulatory Visit: Payer: Self-pay | Admitting: Adult Health

## 2024-04-01 ENCOUNTER — Ambulatory Visit

## 2024-04-04 ENCOUNTER — Ambulatory Visit

## 2024-04-04 DIAGNOSIS — Z3042 Encounter for surveillance of injectable contraceptive: Secondary | ICD-10-CM | POA: Diagnosis not present

## 2024-04-04 MED ORDER — MEDROXYPROGESTERONE ACETATE 150 MG/ML IM SUSP
150.0000 mg | Freq: Once | INTRAMUSCULAR | Status: AC
  Administered 2024-04-04: 150 mg via INTRAMUSCULAR

## 2024-04-04 NOTE — Progress Notes (Signed)
   NURSE VISIT- INJECTION  SUBJECTIVE:  Julie [name] is a 28 y.o. G30P2002 female here for a Depo Provera  for contraception/period management. She is a GYN patient.   OBJECTIVE:  There were no vitals taken for this visit.  Appears well, in no apparent distress  Injection administered in: Left upper quad. gluteus  Meds ordered this encounter  Medications   medroxyPROGESTERone  (DEPO-PROVERA ) injection 150 mg    ASSESSMENT: GYN patient Depo Provera  for contraception/period management PLAN: Follow-up: in 11-13 weeks for next Depo   Rutherford Rover  04/04/2024 11:28 AM

## 2024-04-15 ENCOUNTER — Telehealth: Admitting: Nurse Practitioner

## 2024-04-15 DIAGNOSIS — N761 Subacute and chronic vaginitis: Secondary | ICD-10-CM

## 2024-04-15 NOTE — Progress Notes (Signed)
 Julie [name],  Because your last two episodes of BV and Yeast were treated virtually we suggest you meet with your OBGYN to have vaginal testing done to differentiate your diagnosis. Many women suffer from recurrent vaginitis and your OBGYN can discuss preventative therapies for this, and also give you a concrete diagnosis based on your vaginal testing and pH  Thank you  ,  I feel your condition warrants further evaluation and I recommend that you be seen in a face to face visit with your gynecologist or at one of our [facility] clinics.   NOTE: There will be NO CHARGE for this eVisit   If you are having a true medical emergency please call 911.    *Center for [facility] at [facility]             [address], [place], [place] [address] [phone] (*Take patients with no insurance)  *Center for [employer] at [facility] [address] [place], [place],  [place]  [address] [phone] (*Take patients with no insurance)  Center for [employer] at [employer] [facility]                                                             [address], [place], [place], [address] [phone]  Center for [facility] at [facility] [address], [place], [place], [address] [phone]  Center for [facility] at [facility] [address], [address], [place], [place], [address] [phone]  Center for [facility] at [facility]                                 [address] [place], [place], [place], [address] [phone]  Center for [facility] at [facility]                                    [address] [place], [place], [address] [phone]  Center for [facility] at [facility] [address], [place], [place], [address]                              [facility] [address], [address], [place], [place],  [address] [phone]  Your MyChart E-visit questionnaire answers were reviewed by a board certified advanced clinical practitioner to complete your personal care plan based on your specific symptoms.  Thank you for using e-Visits.

## 2024-04-18 ENCOUNTER — Ambulatory Visit: Admitting: Adult Health

## 2024-04-18 ENCOUNTER — Encounter: Payer: Self-pay | Admitting: Adult Health

## 2024-04-18 VITALS — BP 125/74 | HR 94 | Ht 65.5 in | Wt 164.0 lb

## 2024-04-18 DIAGNOSIS — R030 Elevated blood-pressure reading, without diagnosis of hypertension: Secondary | ICD-10-CM

## 2024-04-18 DIAGNOSIS — R61 Generalized hyperhidrosis: Secondary | ICD-10-CM | POA: Diagnosis not present

## 2024-04-18 MED ORDER — OXYBUTYNIN CHLORIDE ER 5 MG PO TB24: 5.0000 mg | ORAL_TABLET | Freq: Every day | ORAL | 2 refills | Status: AC

## 2024-04-18 NOTE — Progress Notes (Signed)
  Subjective:     Patient ID: Julie [name], female   DOB: 04/13/1996, 28 y.o.   MRN: [id number]  HPI Julie [name] is a 28 year old black female,single, G2P2002 in complaining of sweating under arms, breast and crotch area and smells musky or like onions.     Component Value Date/Time   DIAGPAP  07/10/2023 1457    - Negative for intraepithelial lesion or malignancy (NILM)   DIAGPAP  10/29/2018 0000    NEGATIVE FOR INTRAEPITHELIAL LESIONS OR MALIGNANCY.   HPVHIGH Negative 07/10/2023 1457   ADEQPAP  07/10/2023 1457    Satisfactory for evaluation; transformation zone component ABSENT.   ADEQPAP  10/29/2018 0000    Satisfactory for evaluation  endocervical/transformation zone component PRESENT.    Review of Systems +sweating under arms, breast and crotch area and smells musky or like onions, notices more at night Denies any vaginal discharge Reviewed past medical,surgical, social and family history. Reviewed medications and allergies.     Objective:   Physical Exam BP 125/74 (BP Location: Left Arm, Patient Position: Sitting, Cuff Size: Normal)   Pulse 94   Ht 5' 5.5 (1.664 m)   Wt 164 lb (74.4 kg)   BMI 26.88 kg/m     Skin warm and dry.Pelvic: external genitalia is normal in appearance no lesions, vagina: pink, no odor,urethra has no lesions or masses noted, cervix:smooth and bulbous, uterus: normal size, shape and contour, non tender, no masses felt, adnexa: no masses or tenderness noted. Bladder is non tender and no masses felt.  Fall risk is low  Upstream - 04/18/24 0957       Pregnancy Intention Screening   Does the patient want to become pregnant in the next year? No    Does the patient's partner want to become pregnant in the next year? No    Would the patient like to discuss contraceptive options today? No      Contraception Wrap Up   Current Method Hormonal Injection    End Method Hormonal Injection    Contraception Counseling Provided Yes         Examination  chaperoned by Clarita Salt LPN  Assessment:     1. Generalized hyperhidrosis (Primary) +sweating under arms, breast and crotch area and smells musky or like onions, esp at night Will try oxybutynin  to see if helps  Meds ordered this encounter  Medications   oxybutynin  (DITROPAN -XL) 5 MG 24 hr tablet    Sig: Take 1 tablet (5 mg total) by mouth at bedtime.    Dispense:  30 tablet    Refill:  2    Supervising Provider:   JAYNE MINDER H [[id number]]     2. Elevated BP without diagnosis of hypertension Keep check on BP     Plan:     Follow up in 6 weeks for ROS

## 2024-05-23 ENCOUNTER — Other Ambulatory Visit: Payer: Self-pay | Admitting: Advanced Practice Midwife

## 2024-05-30 ENCOUNTER — Ambulatory Visit: Admitting: Adult Health

## 2024-06-24 ENCOUNTER — Ambulatory Visit

## 2024-06-25 ENCOUNTER — Ambulatory Visit

## 2024-06-25 DIAGNOSIS — Z3042 Encounter for surveillance of injectable contraceptive: Secondary | ICD-10-CM

## 2024-06-25 MED ORDER — MEDROXYPROGESTERONE ACETATE 150 MG/ML IM SUSP
150.0000 mg | Freq: Once | INTRAMUSCULAR | Status: AC
  Administered 2024-06-25: 150 mg via INTRAMUSCULAR

## 2024-06-25 NOTE — Progress Notes (Signed)
   NURSE VISIT- INJECTION  SUBJECTIVE:  Julie [name] is a 29 y.o. G71P2002 female here for a Depo Provera  for contraception/period management. She is a GYN patient.   OBJECTIVE:  There were no vitals taken for this visit.  Appears well, in no apparent distress  Injection administered in: Right upper quad. gluteus  Meds ordered this encounter  Medications   medroxyPROGESTERone  (DEPO-PROVERA ) injection 150 mg    ASSESSMENT: GYN patient Depo Provera  for contraception/period management PLAN: Follow-up: in 11-13 weeks for next Depo   Alan LITTIE Fischer  06/25/2024 2:00 PM

## 2024-09-17 ENCOUNTER — Ambulatory Visit

## 2024-09-18 ENCOUNTER — Ambulatory Visit

## 2024-09-18 DIAGNOSIS — Z3042 Encounter for surveillance of injectable contraceptive: Secondary | ICD-10-CM | POA: Diagnosis not present

## 2024-09-18 MED ORDER — MEDROXYPROGESTERONE ACETATE 150 MG/ML IM SUSP
150.0000 mg | Freq: Once | INTRAMUSCULAR | Status: AC
  Administered 2024-09-18: 150 mg via INTRAMUSCULAR

## 2024-09-18 NOTE — Progress Notes (Signed)
" ° °  NURSE VISIT- INJECTION  SUBJECTIVE:  Julie [name] is a 29 y.o. G70P2002 female here for a Depo Provera  for contraception/period management. She is a GYN patient.   OBJECTIVE:  There were no vitals taken for this visit.  Appears well, in no apparent distress  Injection administered in: Left upper quad. gluteus  Meds ordered this encounter  Medications   medroxyPROGESTERone  (DEPO-PROVERA ) injection 150 mg    ASSESSMENT: GYN patient Depo Provera  for contraception/period management PLAN: Follow-up: in 11-13 weeks for next Depo   Aleck FORBES Blase  09/18/2024 3:52 PM  "

## 2024-12-11 ENCOUNTER — Ambulatory Visit
